# Patient Record
Sex: Male | Born: 1960 | Race: Black or African American | Hispanic: No | Smoking: Never smoker
Health system: Southern US, Community
[De-identification: ages and names within clinical notes are randomized; demographics above are authoritative.]

## PROBLEM LIST (undated history)

## (undated) DIAGNOSIS — J302 Other seasonal allergic rhinitis: Secondary | ICD-10-CM

## (undated) HISTORY — PX: ANKLE SURGERY: SHX546

---

## 2008-07-28 ENCOUNTER — Emergency Department (HOSPITAL_COMMUNITY): Admission: EM | Admit: 2008-07-28 | Discharge: 2008-07-28 | Payer: Self-pay | Admitting: Emergency Medicine

## 2010-04-07 ENCOUNTER — Emergency Department (HOSPITAL_COMMUNITY)
Admission: EM | Admit: 2010-04-07 | Discharge: 2010-04-08 | Payer: Self-pay | Source: Home / Self Care | Admitting: Emergency Medicine

## 2010-04-19 ENCOUNTER — Emergency Department (HOSPITAL_COMMUNITY)
Admission: EM | Admit: 2010-04-19 | Discharge: 2010-04-19 | Payer: Self-pay | Source: Home / Self Care | Admitting: Emergency Medicine

## 2011-05-20 ENCOUNTER — Encounter (HOSPITAL_COMMUNITY): Payer: Self-pay | Admitting: *Deleted

## 2011-05-20 ENCOUNTER — Emergency Department (HOSPITAL_COMMUNITY)
Admission: EM | Admit: 2011-05-20 | Discharge: 2011-05-20 | Disposition: A | Discharge status: Home / Self Care | Payer: Self-pay | Attending: Emergency Medicine | Admitting: Emergency Medicine

## 2011-05-20 DIAGNOSIS — H5789 Other specified disorders of eye and adnexa: Secondary | ICD-10-CM | POA: Insufficient documentation

## 2011-05-20 DIAGNOSIS — H579 Unspecified disorder of eye and adnexa: Secondary | ICD-10-CM | POA: Insufficient documentation

## 2011-05-20 DIAGNOSIS — R0982 Postnasal drip: Secondary | ICD-10-CM | POA: Insufficient documentation

## 2011-05-20 DIAGNOSIS — R51 Headache: Secondary | ICD-10-CM | POA: Insufficient documentation

## 2011-05-20 DIAGNOSIS — J019 Acute sinusitis, unspecified: Secondary | ICD-10-CM | POA: Insufficient documentation

## 2011-05-20 DIAGNOSIS — J3489 Other specified disorders of nose and nasal sinuses: Secondary | ICD-10-CM | POA: Insufficient documentation

## 2011-05-20 DIAGNOSIS — R040 Epistaxis: Secondary | ICD-10-CM | POA: Insufficient documentation

## 2011-05-20 DIAGNOSIS — H9209 Otalgia, unspecified ear: Secondary | ICD-10-CM | POA: Insufficient documentation

## 2011-05-20 HISTORY — DX: Other seasonal allergic rhinitis: J30.2

## 2011-05-20 MED ORDER — TETRACAINE HCL 0.5 % OP SOLN
2.0000 [drp] | Freq: Once | OPHTHALMIC | Status: DC
  Filled 2011-05-20: qty 2

## 2011-05-20 MED ORDER — BUDESONIDE 32 MCG/ACT NA SUSP: 2.0000 | Freq: Every day | NASAL | Status: DC

## 2011-05-20 MED ORDER — HYDROCODONE-ACETAMINOPHEN 5-500 MG PO TABS: 1.0000 | ORAL_TABLET | Freq: Four times a day (QID) | ORAL | Status: AC | PRN

## 2011-05-20 MED ORDER — DOXYCYCLINE HYCLATE 100 MG PO CAPS: 100.0000 mg | ORAL_CAPSULE | Freq: Two times a day (BID) | ORAL | Status: AC

## 2011-05-20 NOTE — ED Provider Notes (Signed)
History     CSN: [id number]  Arrival date & time 05/20/11  1531   First MD Initiated Contact with Patient 05/20/11 1552      Chief Complaint  Patient presents with  . Eye Problem  . Facial Pain    (Consider location/radiation/quality/duration/timing/severity/associated sxs/prior treatment) Patient is a 51 y.o. male presenting with URI. The history is provided by the patient.  URI The primary symptoms include ear pain (right ). Primary symptoms do not include fever, fatigue, headaches, sore throat, cough, wheezing, abdominal pain, nausea, vomiting or rash. Episode onset: about 4 weeks ago  This is a recurrent problem. The problem has been gradually worsening.  Symptoms associated with the illness include facial pain (right side cheek and forehead), sinus pressure (right side), congestion and rhinorrhea. The illness is not associated with chills. The following treatments were addressed: A decongestant was ineffective (zyrtec).    Past Medical History  Diagnosis Date  . Seasonal allergies     History reviewed. No pertinent past surgical history.  History reviewed. No pertinent family history.  History  Substance Use Topics  . Smoking status: Not on file  . Smokeless tobacco: Not on file  . Alcohol Use: No      Review of Systems  Constitutional: Negative for fever, chills, activity change, appetite change and fatigue.  HENT: Positive for ear pain (right ), nosebleeds (occasionally over last month), congestion, rhinorrhea, postnasal drip and sinus pressure (right side). Negative for hearing loss, sore throat, neck pain, neck stiffness and dental problem.   Eyes: Positive for discharge (clear) and itching. Negative for photophobia, redness and visual disturbance.  Respiratory: Negative for cough, shortness of breath and wheezing.   Cardiovascular: Negative for chest pain, palpitations and leg swelling.  Gastrointestinal: Negative for nausea, vomiting, abdominal pain, diarrhea,  constipation and blood in stool.  Genitourinary: Negative for dysuria, urgency, hematuria and flank pain.  Musculoskeletal: Negative for back pain.  Skin: Negative for rash and wound.  Neurological: Negative for dizziness, seizures, facial asymmetry, speech difficulty, weakness, light-headedness, numbness and headaches.  Psychiatric/Behavioral: Negative for confusion.  All other systems reviewed and are negative.    Allergies  Review of patient's allergies indicates no known allergies.  Home Medications   Current Outpatient Rx  Name Route Sig Dispense Refill  . CETIRIZINE HCL 10 MG PO TABS Oral Take 10 mg by mouth daily.      BP 129/86  Pulse 78  Temp(Src) 98.3 F (36.8 C) (Oral)  Resp 20  SpO2 98%  Physical Exam  Nursing note and vitals reviewed. Constitutional: He is oriented to person, place, and time. Vital signs are normal. He appears well-developed and well-nourished.  Non-toxic appearance. No distress.  HENT:  Head: Normocephalic and atraumatic.  Right Ear: Hearing, tympanic membrane, external ear and ear canal normal. No mastoid tenderness.  Left Ear: Hearing, tympanic membrane, external ear and ear canal normal. No mastoid tenderness.  Nose: Mucosal edema (with some superficial erosions), rhinorrhea and sinus tenderness (right side maxillary and frontal sinus tenderness) present. No nasal deformity or septal deviation. No epistaxis. Right sinus exhibits maxillary sinus tenderness and frontal sinus tenderness.  Mouth/Throat: Uvula is midline and oropharynx is clear and moist. Mucous membranes are not dry. No dental caries. No posterior oropharyngeal edema or posterior oropharyngeal erythema.       Post-nasal drainage.  Eyes: Conjunctivae, EOM and lids are normal. Pupils are equal, round, and reactive to light. Right eye exhibits exudate (mild clear drainage). Right eye exhibits no chemosis.  No scleral icterus.  Fundoscopic exam:      The right eye shows no arteriolar  narrowing, no AV nicking, no exudate, no hemorrhage and no papilledema.       The left eye shows no arteriolar narrowing, no AV nicking, no exudate, no hemorrhage and no papilledema.  Neck: Trachea normal and normal range of motion. Neck supple. No JVD present.  Cardiovascular: Normal rate, regular rhythm, normal heart sounds and intact distal pulses.   No murmur heard. Pulmonary/Chest: Effort normal and breath sounds normal. No respiratory distress. He has no wheezes. He has no rales.  Abdominal: Soft. Bowel sounds are normal. He exhibits no distension. There is no tenderness. There is no rebound and no guarding.  Musculoskeletal: Normal range of motion.  Neurological: He is alert and oriented to person, place, and time. He has normal strength. No cranial nerve deficit. GCS eye subscore is 4. GCS verbal subscore is 5. GCS motor subscore is 6.  Skin: Skin is warm and dry. No rash noted. He is not diaphoretic.  Psychiatric: He has a normal mood and affect.    ED Course  Procedures (including critical care time)  Labs Reviewed - No data to display No results found.   1. Sinusitis acute       MDM  50yo AAM with PMH significant for seasonal allergies who presents to the ED due to right sided facial pain. Onset after nasal congestion worsening from baseline seasonal allergies. Last 4 days has had worsening pain right maxillary and frontal sinus region with palpation. Afebrile. NP 129/86. No vision changes. Recent normal complete eye exam. Fundoscopic exam normal. Ears normal. Boggy mucosal edema with some erosion. Will tx with doxy for sinusitis also giving nasal steroids. 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        Verne Carrow, MD 05/20/11 [phone]

## 2011-05-20 NOTE — ED Notes (Signed)
MD at bedside. EDP Webb 

## 2011-05-20 NOTE — ED Notes (Signed)
Reports pain to right side of face adn right eye, reports waking up in the am with redness to right eye.

## 2011-05-20 NOTE — ED Notes (Signed)
Pt reports waking this am with c/o Right cheek pain and Right eye pain. Pt has a hx seasonal allergies

## 2011-05-21 NOTE — ED Provider Notes (Signed)
I saw and evaluated the patient, reviewed the resident's note and I agree with the findings and plan.  No eye redness or tearing. No FB sensation, denies visual changes.  Forbes Cellar, MD 05/21/11 [phone]

## 2011-08-08 ENCOUNTER — Emergency Department (HOSPITAL_COMMUNITY): Payer: Self-pay

## 2011-08-08 ENCOUNTER — Encounter (HOSPITAL_COMMUNITY): Payer: Self-pay | Admitting: Emergency Medicine

## 2011-08-08 ENCOUNTER — Emergency Department (HOSPITAL_COMMUNITY)
Admission: EM | Admit: 2011-08-08 | Discharge: 2011-08-08 | Disposition: A | Discharge status: Home / Self Care | Payer: Self-pay | Attending: Emergency Medicine | Admitting: Emergency Medicine

## 2011-08-08 DIAGNOSIS — J45901 Unspecified asthma with (acute) exacerbation: Secondary | ICD-10-CM | POA: Insufficient documentation

## 2011-08-08 LAB — CBC
HCT: 42.9 % (ref 39.0–52.0)
Hemoglobin: 14.7 g/dL (ref 13.0–17.0)
MCHC: 34.3 g/dL (ref 30.0–36.0)
RBC: 5.4 MIL/uL (ref 4.22–5.81)
WBC: 7.8 10*3/uL (ref 4.0–10.5)

## 2011-08-08 LAB — COMPREHENSIVE METABOLIC PANEL
Alkaline Phosphatase: 62 U/L (ref 39–117)
BUN: 16 mg/dL (ref 6–23)
CO2: 26 mEq/L (ref 19–32)
Chloride: 104 mEq/L (ref 96–112)
Creatinine, Ser: 0.94 mg/dL (ref 0.50–1.35)
GFR calc non Af Amer: >90 mL/min (ref >90)
Glucose, Bld: 96 mg/dL (ref 70–99)
Potassium: 4 mEq/L (ref 3.5–5.1)
Total Bilirubin: 0.4 mg/dL (ref 0.3–1.2)

## 2011-08-08 LAB — DIFFERENTIAL
Lymphocytes Relative: 34 % (ref 12–46)
Lymphs Abs: 2.6 10*3/uL (ref 0.7–4.0)
Monocytes Absolute: 0.3 10*3/uL (ref 0.1–1.0)
Monocytes Relative: 4 % (ref 3–12)
Neutro Abs: 3.8 10*3/uL (ref 1.7–7.7)
Neutrophils Relative %: 48 % (ref 43–77)

## 2011-08-08 MED ORDER — FLUTICASONE PROPIONATE 50 MCG/ACT NA SUSP: 2.0000 | Freq: Every day | NASAL | Status: DC

## 2011-08-08 MED ORDER — IOHEXOL 350 MG/ML SOLN
100.0000 mL | Freq: Once | INTRAVENOUS | Status: AC | PRN
  Administered 2011-08-08: 100 mL via INTRAVENOUS

## 2011-08-08 MED ORDER — MECLIZINE HCL 25 MG PO TABS
25.0000 mg | ORAL_TABLET | Freq: Once | ORAL | Status: AC
  Administered 2011-08-08: 25 mg via ORAL
  Filled 2011-08-08: qty 1

## 2011-08-08 MED ORDER — LORATADINE 10 MG PO TABS: 10.0000 mg | ORAL_TABLET | Freq: Every day | ORAL | Status: AC

## 2011-08-08 MED ORDER — ALBUTEROL SULFATE (5 MG/ML) 0.5% IN NEBU
5.0000 mg | INHALATION_SOLUTION | Freq: Once | RESPIRATORY_TRACT | Status: AC
  Administered 2011-08-08: 5 mg via RESPIRATORY_TRACT
  Filled 2011-08-08: qty 1

## 2011-08-08 MED ORDER — LORATADINE 10 MG PO TABS
10.0000 mg | ORAL_TABLET | Freq: Once | ORAL | Status: AC
  Administered 2011-08-08: 10 mg via ORAL
  Filled 2011-08-08 (×2): qty 1

## 2011-08-08 MED ORDER — PREDNISONE 20 MG PO TABS
60.0000 mg | ORAL_TABLET | Freq: Once | ORAL | Status: AC
  Administered 2011-08-08: 60 mg via ORAL
  Filled 2011-08-08: qty 3

## 2011-08-08 MED ORDER — PREDNISONE 20 MG PO TABS: 60.0000 mg | ORAL_TABLET | Freq: Every day | ORAL | Status: AC

## 2011-08-08 MED ORDER — PREDNISONE 20 MG PO TABS: 60.0000 mg | ORAL_TABLET | Freq: Every day | ORAL | Status: DC

## 2011-08-08 NOTE — ED Provider Notes (Signed)
History     CSN: [id number]  Arrival date & time 08/08/11  1729   First MD Initiated Contact with Patient 08/08/11 1811      Chief Complaint  Patient presents with  . Chest Pain  . Shortness of Breath  . Nasal Congestion    (Consider location/radiation/quality/duration/timing/severity/associated sxs/prior treatment) HPI Comments: Left-sided chest pain reproducible on palpation. His pleuritic. Worse with breathing. Worsening allergies  Patient is a 51 y.o. male presenting with shortness of breath. The history is provided by the patient. No language interpreter was used.  Shortness of Breath  The current episode started yesterday. The onset was gradual. The problem occurs continuously. The problem has been gradually worsening. The problem is moderate. The symptoms are relieved by nothing. The symptoms are aggravated by activity and allergens. Associated symptoms include chest pain, rhinorrhea, cough, shortness of breath and wheezing. Pertinent negatives include no chest pressure, no orthopnea, no fever and no sore throat. The cough is dry. He has had intermittent steroid use. His past medical history is significant for asthma.    Past Medical History  Diagnosis Date  . Seasonal allergies   . Asthma     History reviewed. No pertinent past surgical history.  No family history on file.  History  Substance Use Topics  . Smoking status: Not on file  . Smokeless tobacco: Not on file  . Alcohol Use: No      Review of Systems  Constitutional: Negative for fever, activity change, appetite change and fatigue.  HENT: Positive for congestion and rhinorrhea. Negative for sore throat, neck pain and neck stiffness.   Eyes: Positive for redness and itching.  Respiratory: Positive for cough, shortness of breath and wheezing.   Cardiovascular: Positive for chest pain. Negative for palpitations and orthopnea.  Gastrointestinal: Negative for nausea, vomiting and abdominal pain.    Genitourinary: Negative for dysuria, urgency, frequency and flank pain.  Musculoskeletal: Negative for myalgias, back pain and arthralgias.  Neurological: Negative for dizziness, weakness, light-headedness, numbness and headaches.  All other systems reviewed and are negative.    Allergies  Review of patient's allergies indicates no known allergies.  Home Medications   Current Outpatient Rx  Name Route Sig Dispense Refill  . FEXOFENADINE HCL 180 MG PO TABS Oral Take 180 mg by mouth daily.    Marland Kitchen FLUTICASONE PROPIONATE 50 MCG/ACT NA SUSP Nasal Place 2 sprays into the nose daily. 16 g 0  . LORATADINE 10 MG PO TABS Oral Take 1 tablet (10 mg total) by mouth daily. 30 tablet 0  . PREDNISONE 20 MG PO TABS Oral Take 3 tablets (60 mg total) by mouth daily. 15 tablet 0    BP 120/90  Pulse 72  Temp(Src) 97.8 F (36.6 C) (Oral)  Resp 11  SpO2 100%  Physical Exam  Nursing note and vitals reviewed. Constitutional: He is oriented to person, place, and time. He appears well-developed and well-nourished.  HENT:  Head: Normocephalic and atraumatic.  Right Ear: External ear normal.  Left Ear: External ear normal.  Mouth/Throat: Oropharynx is clear and moist.       Rhinorrhea and significant congestion.  Boggy turbinates  Eyes: Conjunctivae and EOM are normal. Pupils are equal, round, and reactive to light.  Neck: Normal range of motion. Neck supple.  Cardiovascular: Normal rate, regular rhythm, normal heart sounds and intact distal pulses.  Exam reveals no gallop and no friction rub.   No murmur heard. Pulmonary/Chest: Effort normal. He has wheezes. He has no rales. He  exhibits no tenderness.  Abdominal: Soft. Bowel sounds are normal. There is no tenderness. There is no rebound and no guarding.  Musculoskeletal: Normal range of motion. He exhibits no edema and no tenderness.  Neurological: He is alert and oriented to person, place, and time. No cranial nerve deficit.  Skin: Skin is warm and  dry.    ED Course  Procedures (including critical care time)  Labs Reviewed  DIFFERENTIAL - Abnormal; Notable for the following:    Eosinophils Relative 13 (*)    Eosinophils Absolute 1.0 (*)    All other components within normal limits  CBC  COMPREHENSIVE METABOLIC PANEL   Dg Chest 2 View  08/08/2011  *RADIOLOGY REPORT*  Clinical Data: Left breast pain, severe shortness of breath, history asthma  CHEST - 2 VIEW  Comparison: 04/19/2010  Findings: Upper-normal size of cardiac silhouette. Tortuous aorta. Pulmonary vascularity normal. Oblong area of increased density in the right upper lobe, question atelectasis or superimposed artifact but pulmonary nodule is not excluded. Remaining lungs clear. No pleural effusion or pneumothorax. Bones unremarkable.  IMPRESSION: Questionable area of increased density in the right upper lobe, could represent atelectasis, artifact or a pulmonary nodule; recommend CT chest to exclude pulmonary nodule. No acute infiltrate identified.  Original Report Authenticated By: Lollie Marrow, M.D.   Ct Angio Chest W/cm &/or Wo Cm  08/08/2011  *RADIOLOGY REPORT*  Clinical Data: Shortness of breath, chest pain, question pulmonary embolism  CT ANGIOGRAPHY CHEST  Technique:  Multidetector CT imaging of the chest using the standard protocol during bolus administration of intravenous contrast. Multiplanar reconstructed images including MIPs were obtained and reviewed to evaluate the vascular anatomy.  Contrast: OMNIPAQUE IOHEXOL 350 MG/ML SOLN  Comparison: None Correlation:  Chest radiograph 08/08/2011  Findings: Aorta normal caliber without aneurysm or dissection. Pulmonary arteries patent. No evidence of pulmonary embolism. No thoracic adenopathy. Visualized portion of upper abdomen normal appearance. Dependent atelectasis in the lower lobes bilaterally. No definite pulmonary infiltrate, pleural effusion, or pneumothorax. 3 mm right upper lobe nodule image 35, nonspecific. 4  mm right middle lobe nodule image 42, nonspecific. Vague area of sclerosis identified in the posterior right sixth rib, corresponding in position to the abnormality seen on preceding chest radiograph.  IMPRESSION: No evidence of pulmonary embolism. Dependent atelectasis bilateral lower lobes. No right upper lobe mass or nodule identified; abnormality identified on chest radiograph appears to be due to nonspecific sclerosis in the posterior right sixth rib. This is of uncertain etiology and significance. If the patient has prior outside CT chest exams, these would be of benefit in establishing stability of this finding. In the absence of prior CTs to confirm stability, consider follow- up non emergent radionuclide bone scan to assess.  Nonspecific right lung nodules, recommendation below. If the patient is at high risk for bronchogenic carcinoma, follow- up chest CT at 1 year is recommended.  If the patient is at low risk, no follow-up is needed.  This recommendation follows the consensus statement: Guidelines for Management of Small Pulmonary Nodules Detected on CT Scans:  A Statement from the Fleischner Society as published in Radiology 2005; 237:395-400.  Original Report Authenticated By: Lollie Marrow, M.D.     1. Asthma exacerbation   2. Allergic rhinitis       MDM  Asthma exacerbation allergic rhinitis. He was prescribed Claritin, Flonase for his allergic rhinitis type symptoms. He has significant congestion secondary to this. Asthma and wheezing has resolved. He was placed on prednisone. He has  albuterol home. Provided strict return precautions.  No pe on imaging.  Pain has been constant for 6 hours and has since resolved with treatment.  No concern for ACS and ruled out with single trop        Dayton Bailiff, MD 08/08/11 2004

## 2011-08-08 NOTE — ED Notes (Signed)
NO CHEST PAIN AT PRESENT

## 2011-08-08 NOTE — ED Notes (Signed)
Onset 3 days ago nasal congestion shortness of breath and chest pain.  Continued today.  Pain currently 8/10 pressure.  Ax4.

## 2011-08-08 NOTE — ED Notes (Signed)
The pt says he has had nasal congestion and he has allergies and he has been coughing and wheezing since last pm.  Non-productive cough

## 2011-08-08 NOTE — ED Notes (Signed)
Iv inserted in the rt a-c  # 20 SALINE LOK FOR C-T OF CHEST

## 2011-08-08 NOTE — Discharge Instructions (Signed)
Allergic Rhinitis Allergic rhinitis is when the mucous membranes in the nose respond to allergens. Allergens are particles in the air that cause your body to have an allergic reaction. This causes you to release allergic antibodies. Through a chain of events, these eventually cause you to release histamine into the blood stream (hence the use of antihistamines). Although meant to be protective to the body, it is this release that causes your discomfort, such as frequent sneezing, congestion and an itchy runny nose.  CAUSES  The pollen allergens may come from grasses, trees, and weeds. This is seasonal allergic rhinitis, or "hay fever." Other allergens cause year-round allergic rhinitis (perennial allergic rhinitis) such as house dust mite allergen, pet dander and mold spores.  SYMPTOMS   Nasal stuffiness (congestion).   Runny, itchy nose with sneezing and tearing of the eyes.   There is often an itching of the mouth, eyes and ears.  It cannot be cured, but it can be controlled with medications. DIAGNOSIS  If you are unable to determine the offending allergen, skin or blood testing may find it. TREATMENT   Avoid the allergen.   Medications and allergy shots (immunotherapy) can help.   Hay fever may often be treated with antihistamines in pill or nasal spray forms. Antihistamines block the effects of histamine. There are over-the-counter medicines that may help with nasal congestion and swelling around the eyes. Check with your caregiver before taking or giving this medicine.  If the treatment above does not work, there are many new medications your caregiver can prescribe. Stronger medications may be used if initial measures are ineffective. Desensitizing injections can be used if medications and avoidance fails. Desensitization is when a patient is given ongoing shots until the body becomes less sensitive to the allergen. Make sure you follow up with your caregiver if problems continue. SEEK  MEDICAL CARE IF:   You develop fever (more than 100.5 F (38.1 C).   You develop a cough that does not stop easily (persistent).   You have shortness of breath.   You start wheezing.   Symptoms interfere with normal daily activities.  Document Released: 12/20/2000 Document Revised: 03/16/2011 Document Reviewed: 07/01/2008 [employer] Patient Information 2012 [employer].Asthma, Adult Asthma is caused by narrowing of the air passages in the lungs. It may be triggered by pollen, dust, animal dander, molds, some foods, respiratory infections, exposure to smoke, exercise, emotional stress or other allergens (things that cause allergic reactions or allergies). Repeat attacks are common. HOME CARE INSTRUCTIONS   Use prescription medications as ordered by your caregiver.   Avoid pollen, dust, animal dander, molds, smoke and other things that cause attacks at home and at work.   You may have fewer attacks if you decrease dust in your home. Electrostatic air cleaners may help.   It may help to replace your pillows or mattress with materials less likely to cause allergies.   Talk to your caregiver about an action plan for managing asthma attacks at home, including, the use of a peak flow meter which measures the severity of your asthma attack. An action plan can help minimize or stop the attack without having to seek medical care.   If you are not on a fluid restriction, drink 8 to 10 glasses of water each day.   Always have a plan prepared for seeking medical attention, including, calling your physician, accessing local emergency care, and calling 911 (in the [place] for a severe attack.   Discuss possible exercise routines with your   caregiver.   If animal dander is the cause of asthma, you may need to get rid of pets.  SEEK MEDICAL CARE IF:   You have wheezing and shortness of breath even if taking medicine to prevent attacks.   You have muscle aches, chest pain or thickening of sputum.     Your sputum changes from clear or white to yellow, green, gray, or bloody.   You have any problems that may be related to the medicine you are taking (such as a rash, itching, swelling or trouble breathing).  SEEK IMMEDIATE MEDICAL CARE IF:   Your usual medicines do not stop your wheezing or there is increased coughing and/or shortness of breath.   You have increased difficulty breathing.   You have a fever.  MAKE SURE YOU:   Understand these instructions.   Will watch your condition.   Will get help right away if you are not doing well or get worse.  Document Released: 03/27/2005 Document Revised: 03/16/2011 Document Reviewed: 11/13/2007 [employer] Patient Information 2012 [employer]. 

## 2011-08-09 LAB — POCT I-STAT TROPONIN I: Troponin i, poc: 0 ng/mL (ref 0.00–0.08)

## 2013-02-13 ENCOUNTER — Encounter (HOSPITAL_BASED_OUTPATIENT_CLINIC_OR_DEPARTMENT_OTHER): Payer: Self-pay | Admitting: Emergency Medicine

## 2013-02-13 ENCOUNTER — Emergency Department (HOSPITAL_BASED_OUTPATIENT_CLINIC_OR_DEPARTMENT_OTHER)
Admission: EM | Admit: 2013-02-13 | Discharge: 2013-02-13 | Disposition: A | Discharge status: Home / Self Care | Payer: 59 | Attending: Emergency Medicine | Admitting: Emergency Medicine

## 2013-02-13 DIAGNOSIS — R21 Rash and other nonspecific skin eruption: Secondary | ICD-10-CM | POA: Diagnosis present

## 2013-02-13 DIAGNOSIS — L299 Pruritus, unspecified: Secondary | ICD-10-CM | POA: Diagnosis not present

## 2013-02-13 DIAGNOSIS — Z79899 Other long term (current) drug therapy: Secondary | ICD-10-CM | POA: Diagnosis not present

## 2013-02-13 DIAGNOSIS — J45901 Unspecified asthma with (acute) exacerbation: Secondary | ICD-10-CM | POA: Insufficient documentation

## 2013-02-13 DIAGNOSIS — L282 Other prurigo: Secondary | ICD-10-CM

## 2013-02-13 MED ORDER — PREDNISONE 20 MG PO TABS: ORAL_TABLET | ORAL | Status: DC

## 2013-02-13 MED ORDER — HYDROXYZINE HCL 25 MG PO TABS: 25.0000 mg | ORAL_TABLET | Freq: Four times a day (QID) | ORAL | Status: DC

## 2013-02-13 NOTE — ED Notes (Addendum)
Rash on his legs, arms and hands since last night. States he vomited and started wheezing 3 hours ago. Took Vistaril last night to stop the itching. States his throat feels dry. No respiratory distress. Hives on his upper legs. His hands are red.

## 2013-02-13 NOTE — ED Notes (Signed)
Started wheezing and having rash last pm, unknown cause  Stills has rash and itching,  No wheezing at present

## 2013-02-13 NOTE — ED Provider Notes (Signed)
CSN: [id number]     Arrival date & time 02/13/13  1447 History   First MD Initiated Contact with Patient 02/13/13 1500     Chief Complaint  Patient presents with  . Rash  . Wheezing   (Consider location/radiation/quality/duration/timing/severity/associated sxs/prior Treatment) HPI Comments: 52 year old male with history of chronic allergies presents with a rash and itching since last night. He states he has severe allergies to mites, dust, and other seasonal allergies and has to usually use specific soaps and other detergents due to how severe his reactions are. He states last night started having itching similar to the time he had one year ago. He states he had a leftover hydroxyzine she took that which partially relieved his symptoms. He did not have any new exposures to food, detergents, or other known allergens. He's not having any current respiratory symptoms. He states he did vomit his lunch does not feel ill since then. Denies abdominal pain, current nausea, or wheezing. He states his throat was also or after the vomiting episode but denies any shortness of breath. He does have a history of asthma states he took his inhaler earlier and currently has no symptoms.   Past Medical History  Diagnosis Date  . Seasonal allergies   . Asthma    History reviewed. No pertinent past surgical history. No family history on file. History  Substance Use Topics  . Smoking status: Never Smoker   . Smokeless tobacco: Not on file  . Alcohol Use: No    Review of Systems  HENT: Negative for sore throat, trouble swallowing and voice change.   Respiratory: Negative for shortness of breath and wheezing.   Gastrointestinal: Positive for vomiting (once). Negative for nausea, abdominal pain and diarrhea.  Skin: Positive for rash.  All other systems reviewed and are negative.    Allergies  Review of patient's allergies indicates no known allergies.  Home Medications   Current Outpatient Rx  Name   Route  Sig  Dispense  Refill  . fexofenadine (ALLEGRA) 180 MG tablet   Oral   Take 180 mg by mouth daily.         Marland Kitchen EXPIRED: fluticasone (FLONASE) 50 MCG/ACT nasal spray   Nasal   Place 2 sprays into the nose daily.   16 g   0   . hydrOXYzine (ATARAX/VISTARIL) 25 MG tablet   Oral   Take 1 tablet (25 mg total) by mouth every 6 (six) hours.   12 tablet   0   . EXPIRED: loratadine (CLARITIN) 10 MG tablet   Oral   Take 1 tablet (10 mg total) by mouth daily.   30 tablet   0   . predniSONE (DELTASONE) 20 MG tablet      2 tabs po daily x 5 days   10 tablet   0    BP 140/88  Pulse 83  Temp(Src) 98.9 F (37.2 C) (Oral)  Resp 18  SpO2 100% Physical Exam  Nursing note and vitals reviewed. Constitutional: He is oriented to person, place, and time. He appears well-developed and well-nourished. No distress.  HENT:  Head: Normocephalic and atraumatic.  Right Ear: External ear normal.  Left Ear: External ear normal.  Nose: Nose normal.  Eyes: Right eye exhibits no discharge. Left eye exhibits no discharge.  Neck: Neck supple.  Cardiovascular: Normal rate, regular rhythm, normal heart sounds and intact distal pulses.   Pulmonary/Chest: Effort normal and breath sounds normal. No stridor. He has no wheezes.  Abdominal: Soft. There  is no tenderness.  Musculoskeletal: He exhibits no edema.  Neurological: He is alert and oriented to person, place, and time.  Skin: Skin is warm and dry. Rash noted. Rash is urticarial (vague urticarial rash on lower extremities and upper extremities).    ED Course  Procedures (including critical care time) Labs Review Labs Reviewed - No data to display Imaging Review No results found.  EKG Interpretation   None       MDM   1. Pruritic rash    Patient with severe allergies with a recurrent paretic rash. It is difficult to decipher exactly what the rash is like to skin color. However there no other signs of systemic allergic reaction.  He did state he vomited after lunch several hours ago but does not have any current wheezing, throat symptoms, recurrent nausea or abdominal pain. I do not think there is a risk of anaphylaxis. We'll treat with Vistaril and a prednisone burst. Given the degree of his allergic symptoms in the past I recommended that he followup with an allergist.    Audree Camel, MD 02/13/13 1524

## 2013-04-14 ENCOUNTER — Emergency Department (HOSPITAL_BASED_OUTPATIENT_CLINIC_OR_DEPARTMENT_OTHER)
Admission: EM | Admit: 2013-04-14 | Discharge: 2013-04-14 | Disposition: A | Discharge status: Home / Self Care | Payer: Managed Care, Other (non HMO) | Attending: Emergency Medicine | Admitting: Emergency Medicine

## 2013-04-14 ENCOUNTER — Encounter (HOSPITAL_BASED_OUTPATIENT_CLINIC_OR_DEPARTMENT_OTHER): Payer: Self-pay | Admitting: Emergency Medicine

## 2013-04-14 DIAGNOSIS — J45909 Unspecified asthma, uncomplicated: Secondary | ICD-10-CM | POA: Insufficient documentation

## 2013-04-14 DIAGNOSIS — IMO0002 Reserved for concepts with insufficient information to code with codable children: Secondary | ICD-10-CM | POA: Insufficient documentation

## 2013-04-14 DIAGNOSIS — T492X5A Adverse effect of local astringents and local detergents, initial encounter: Secondary | ICD-10-CM | POA: Insufficient documentation

## 2013-04-14 DIAGNOSIS — L509 Urticaria, unspecified: Secondary | ICD-10-CM | POA: Insufficient documentation

## 2013-04-14 DIAGNOSIS — Z79899 Other long term (current) drug therapy: Secondary | ICD-10-CM | POA: Insufficient documentation

## 2013-04-14 DIAGNOSIS — Z9109 Other allergy status, other than to drugs and biological substances: Secondary | ICD-10-CM | POA: Insufficient documentation

## 2013-04-14 MED ORDER — PREDNISONE 20 MG PO TABS
40.0000 mg | ORAL_TABLET | Freq: Every day | ORAL | Status: DC
Start: 2013-04-14 — End: 2013-12-31

## 2013-04-14 MED ORDER — DIPHENHYDRAMINE HCL 50 MG/ML IJ SOLN
25.0000 mg | Freq: Once | INTRAMUSCULAR | Status: AC
  Administered 2013-04-14: 25 mg via INTRAMUSCULAR
  Filled 2013-04-14: qty 1

## 2013-04-14 MED ORDER — METHYLPREDNISOLONE SODIUM SUCC 125 MG IJ SOLR
125.0000 mg | Freq: Once | INTRAMUSCULAR | Status: AC
  Administered 2013-04-14: 125 mg via INTRAMUSCULAR
  Filled 2013-04-14: qty 2

## 2013-04-14 NOTE — Discharge Instructions (Signed)
Allergies °Allergies may happen from anything your body is sensitive to. This may be food, medicines, pollens, chemicals, and nearly anything around you in everyday life that produces allergens. An allergen is anything that causes an allergy producing substance. Heredity is often a factor in causing these problems. This means you may have some of the same allergies as your parents. °Food allergies happen in all age groups. Food allergies are some of the most severe and life threatening. Some common food allergies are cow's milk, seafood, eggs, nuts, wheat, and soybeans. °SYMPTOMS  °· Swelling around the mouth. °· An itchy red rash or hives. °· Vomiting or diarrhea. °· Difficulty breathing. °SEVERE ALLERGIC REACTIONS ARE LIFE-THREATENING. °This reaction is called anaphylaxis. It can cause the mouth and throat to swell and cause difficulty with breathing and swallowing. In severe reactions only a trace amount of food (for example, peanut oil in a salad) may cause death within seconds. °Seasonal allergies occur in all age groups. These are seasonal because they usually occur during the same season every year. They may be a reaction to molds, grass pollens, or tree pollens. Other causes of problems are house dust mite allergens, pet dander, and mold spores. The symptoms often consist of nasal congestion, a runny itchy nose associated with sneezing, and tearing itchy eyes. There is often an associated itching of the mouth and ears. The problems happen when you come in contact with pollens and other allergens. Allergens are the particles in the air that the body reacts to with an allergic reaction. This causes you to release allergic antibodies. Through a chain of events, these eventually cause you to release histamine into the blood stream. Although it is meant to be protective to the body, it is this release that causes your discomfort. This is why you were given anti-histamines to feel better.  If you are unable to  pinpoint the offending allergen, it may be determined by skin or blood testing. Allergies cannot be cured but can be controlled with medicine. °Hay fever is a collection of all or some of the seasonal allergy problems. It may often be treated with simple over-the-counter medicine such as diphenhydramine. Take medicine as directed. Do not drink alcohol or drive while taking this medicine. Check with your caregiver or package insert for child dosages. °If these medicines are not effective, there are many new medicines your caregiver can prescribe. Stronger medicine such as nasal spray, eye drops, and corticosteroids may be used if the first things you try do not work well. Other treatments such as immunotherapy or desensitizing injections can be used if all else fails. Follow up with your caregiver if problems continue. These seasonal allergies are usually not life threatening. They are generally more of a nuisance that can often be handled using medicine. °HOME CARE INSTRUCTIONS  °· If unsure what causes a reaction, keep a diary of foods eaten and symptoms that follow. Avoid foods that cause reactions. °· If hives or rash are present: °· Take medicine as directed. °· You may use an over-the-counter antihistamine (diphenhydramine) for hives and itching as needed. °· Apply cold compresses (cloths) to the skin or take baths in cool water. Avoid hot baths or showers. Heat will make a rash and itching worse. °· If you are severely allergic: °· Following a treatment for a severe reaction, hospitalization is often required for closer follow-up. °· Wear a medic-alert bracelet or necklace stating the allergy. °· You and your family must learn how to give adrenaline or use   an anaphylaxis kit. °· If you have had a severe reaction, always carry your anaphylaxis kit or EpiPen® with you. Use this medicine as directed by your caregiver if a severe reaction is occurring. Failure to do so could have a fatal outcome. °SEEK MEDICAL  CARE IF: °· You suspect a food allergy. Symptoms generally happen within 30 minutes of eating a food. °· Your symptoms have not gone away within 2 days or are getting worse. °· You develop new symptoms. °· You want to retest yourself or your child with a food or drink you think causes an allergic reaction. Never do this if an anaphylactic reaction to that food or drink has happened before. Only do this under the care of a caregiver. °SEEK IMMEDIATE MEDICAL CARE IF:  °· You have difficulty breathing, are wheezing, or have a tight feeling in your chest or throat. °· You have a swollen mouth, or you have hives, swelling, or itching all over your body. °· You have had a severe reaction that has responded to your anaphylaxis kit or an EpiPen®. These reactions may return when the medicine has worn off. These reactions should be considered life threatening. °MAKE SURE YOU:  °· Understand these instructions. °· Will watch your condition. °· Will get help right away if you are not doing well or get worse. °Document Released: 06/20/2002 Document Revised: 07/22/2012 Document Reviewed: 11/25/2007 °[employer]® Patient Information ©2014 [employer]. ° °

## 2013-04-14 NOTE — ED Provider Notes (Signed)
Medical screening examination/treatment/procedure(s) were performed by non-physician practitioner and as supervising physician I was immediately available for consultation/collaboration.  EKG Interpretation   None         Megan E Docherty, MD 04/14/13 2340 

## 2013-04-14 NOTE — ED Notes (Addendum)
Hives off and on x 4 days. Has been taking Benadryl and Vistaril. Had left over Prednisone and took what he had. He had the same thing happen to him in November.

## 2013-04-14 NOTE — ED Provider Notes (Signed)
CSN: [id number]     Arrival date & time 04/14/13  1559 History   First MD Initiated Contact with Patient 04/14/13 1649     Chief Complaint  Patient presents with  . Urticaria   (Consider location/radiation/quality/duration/timing/severity/associated sxs/prior Treatment) HPI Comments: Pt states that he has had intermittent episodes of hives over the last 2 months:pt states that he has changed detergents and body washes and the symptoms have continued:pt denies oral swelling or difficulty breathing:no fever  The history is provided by the patient. No language interpreter was used.    Past Medical History  Diagnosis Date  . Seasonal allergies   . Asthma    History reviewed. No pertinent past surgical history. No family history on file. History  Substance Use Topics  . Smoking status: Never Smoker   . Smokeless tobacco: Not on file  . Alcohol Use: No    Review of Systems  Constitutional: Negative.   Respiratory: Negative.   Cardiovascular: Negative.     Allergies  Review of patient's allergies indicates no known allergies.  Home Medications   Current Outpatient Rx  Name  Route  Sig  Dispense  Refill  . fexofenadine (ALLEGRA) 180 MG tablet   Oral   Take 180 mg by mouth daily.         Marland Kitchen. EXPIRED: fluticasone (FLONASE) 50 MCG/ACT nasal spray   Nasal   Place 2 sprays into the nose daily.   16 g   0   . hydrOXYzine (ATARAX/VISTARIL) 25 MG tablet   Oral   Take 1 tablet (25 mg total) by mouth every 6 (six) hours.   12 tablet   0   . EXPIRED: loratadine (CLARITIN) 10 MG tablet   Oral   Take 1 tablet (10 mg total) by mouth daily.   30 tablet   0   . predniSONE (DELTASONE) 20 MG tablet      2 tabs po daily x 5 days   10 tablet   0    BP 116/86  Pulse 78  Temp(Src) 98.1 F (36.7 C) (Oral)  Resp 18  Ht 5\' 11"  (1.803 m)  Wt 245 lb (111.131 kg)  BMI 34.19 kg/m2  SpO2 100% Physical Exam  Nursing note and vitals reviewed. Constitutional: He is oriented to  person, place, and time. He appears well-developed and well-nourished.  HENT:  Head: Normocephalic and atraumatic.  Right Ear: External ear normal.  Left Ear: External ear normal.  Eyes: Conjunctivae and EOM are normal.  Cardiovascular: Normal rate and regular rhythm.   Pulmonary/Chest: Effort normal and breath sounds normal.  Musculoskeletal: Normal range of motion.  Neurological: He is alert and oriented to person, place, and time.  Skin: Skin is warm.  Large red papules to entire body  Psychiatric: He has a normal mood and affect.    ED Course  Procedures (including critical care time) Labs Review Labs Reviewed - No data to display Imaging Review No results found.  EKG Interpretation   None       MDM   1. Hives    Hives are decreasing:will send home with steroids:discussed follow up with an allergist with the pt   Teressa LowerVrinda Blen Ransome, NP 04/14/13 1805

## 2013-05-11 ENCOUNTER — Encounter (HOSPITAL_BASED_OUTPATIENT_CLINIC_OR_DEPARTMENT_OTHER): Payer: Self-pay | Admitting: Emergency Medicine

## 2013-05-11 ENCOUNTER — Emergency Department (HOSPITAL_BASED_OUTPATIENT_CLINIC_OR_DEPARTMENT_OTHER)
Admission: EM | Admit: 2013-05-11 | Discharge: 2013-05-11 | Disposition: A | Discharge status: Home / Self Care | Payer: Managed Care, Other (non HMO) | Attending: Emergency Medicine | Admitting: Emergency Medicine

## 2013-05-11 DIAGNOSIS — Z79899 Other long term (current) drug therapy: Secondary | ICD-10-CM | POA: Insufficient documentation

## 2013-05-11 DIAGNOSIS — L509 Urticaria, unspecified: Secondary | ICD-10-CM | POA: Insufficient documentation

## 2013-05-11 DIAGNOSIS — J45909 Unspecified asthma, uncomplicated: Secondary | ICD-10-CM | POA: Insufficient documentation

## 2013-05-11 MED ORDER — PREDNISONE 50 MG PO TABS
60.0000 mg | ORAL_TABLET | Freq: Once | ORAL | Status: AC
  Administered 2013-05-11: 60 mg via ORAL
  Filled 2013-05-11 (×2): qty 1

## 2013-05-11 MED ORDER — HYDROXYZINE HCL 25 MG PO TABS: 25.0000 mg | ORAL_TABLET | Freq: Four times a day (QID) | ORAL | Status: DC

## 2013-05-11 MED ORDER — HYDROXYZINE HCL 25 MG PO TABS
50.0000 mg | ORAL_TABLET | Freq: Once | ORAL | Status: AC
  Administered 2013-05-11: 50 mg via ORAL
  Filled 2013-05-11: qty 2

## 2013-05-11 MED ORDER — PREDNISONE 10 MG PO TABS
20.0000 mg | ORAL_TABLET | Freq: Every day | ORAL | Status: DC
Start: 2013-05-11 — End: 2013-12-31

## 2013-05-11 NOTE — ED Notes (Signed)
Patient c/o red itching rash on upper and lower body since last night, took benadry at 22:00 but no relief, states appointment this month  For allergies

## 2013-05-11 NOTE — Discharge Instructions (Signed)

## 2013-05-13 NOTE — ED Provider Notes (Signed)
CSN: [id number]     Arrival date & time 05/11/13  1038 History   First MD Initiated Contact with Patient 05/11/13 1048     Chief Complaint  Patient presents with  . Rash   (Consider location/radiation/quality/duration/timing/severity/associated sxs/prior Treatment) HPI  53 year old male comes in today complaining of rash that began last night. It has moved on to different parts of his body and resolved in other areas. It is very itchy. He has not had any associated symptoms of throat swelling, nasal discharge, vomiting, diarrhea, or difficulty breathing. He had a similar episode a month ago and was diagnosed with hives. He got better with Vistaril and prednisone at that time. He has taken Benadryl at home with some relief but has not taken it again today. He does not have any history of aspirin allergy or eczema or asthma.  Past Medical History  Diagnosis Date  . Seasonal allergies   . Asthma    Past Surgical History  Procedure Laterality Date  . Ankle surgery Right    No family history on file. History  Substance Use Topics  . Smoking status: Never Smoker   . Smokeless tobacco: Not on file  . Alcohol Use: No    Review of Systems  All other systems reviewed and are negative.    Allergies  Review of patient's allergies indicates no known allergies.  Home Medications   Current Outpatient Rx  Name  Route  Sig  Dispense  Refill  . fexofenadine (ALLEGRA) 180 MG tablet   Oral   Take 180 mg by mouth daily.         Marland Kitchen. EXPIRED: fluticasone (FLONASE) 50 MCG/ACT nasal spray   Nasal   Place 2 sprays into the nose daily.   16 g   0   . hydrOXYzine (ATARAX/VISTARIL) 25 MG tablet   Oral   Take 1 tablet (25 mg total) by mouth every 6 (six) hours.   12 tablet   0   . hydrOXYzine (ATARAX/VISTARIL) 25 MG tablet   Oral   Take 1 tablet (25 mg total) by mouth every 6 (six) hours.   12 tablet   0   . EXPIRED: loratadine (CLARITIN) 10 MG tablet   Oral   Take 1 tablet (10 mg  total) by mouth daily.   30 tablet   0   . predniSONE (DELTASONE) 10 MG tablet   Oral   Take 2 tablets (20 mg total) by mouth daily.   15 tablet   0   . predniSONE (DELTASONE) 20 MG tablet      2 tabs po daily x 5 days   10 tablet   0   . predniSONE (DELTASONE) 20 MG tablet   Oral   Take 2 tablets (40 mg total) by mouth daily.   10 tablet   0    BP 118/74  Pulse 76  Temp(Src) 98 F (36.7 C) (Oral)  Resp 18  SpO2 100% Physical Exam  Nursing note and vitals reviewed. Constitutional: He is oriented to person, place, and time. He appears well-developed and well-nourished.  HENT:  Head: Normocephalic and atraumatic.  Right Ear: External ear normal.  Nose: Nose normal.  Mouth/Throat: Oropharynx is clear and moist.  Eyes: Conjunctivae and EOM are normal. Pupils are equal, round, and reactive to light.  Neck: Normal range of motion. Neck supple.  Cardiovascular: Normal rate, regular rhythm and normal heart sounds.   Pulmonary/Chest: Effort normal and breath sounds normal.  Abdominal: Soft. Bowel sounds are  normal.  Musculoskeletal: Normal range of motion.  Neurological: He is alert and oriented to person, place, and time. He has normal reflexes.  Skin: Skin is dry. Rash noted.  Slightly raised erythematous areas diffusely on his lower legs back and upper arms consistent with hives.  Psychiatric: He has a normal mood and affect. His behavior is normal. Thought content normal.    ED Course  Procedures (including critical care time) Labs Review Labs Reviewed - No data to display Imaging Review No results found.  EKG Interpretation   None       MDM   1. Hives         Hilario Quarry, MD 05/13/13 [phone]

## 2013-12-31 ENCOUNTER — Encounter (HOSPITAL_COMMUNITY): Payer: Self-pay | Admitting: Emergency Medicine

## 2013-12-31 ENCOUNTER — Emergency Department (HOSPITAL_COMMUNITY)
Admission: EM | Admit: 2013-12-31 | Discharge: 2013-12-31 | Disposition: A | Discharge status: Home / Self Care | Payer: Managed Care, Other (non HMO) | Attending: Emergency Medicine | Admitting: Emergency Medicine

## 2013-12-31 DIAGNOSIS — Z792 Long term (current) use of antibiotics: Secondary | ICD-10-CM | POA: Insufficient documentation

## 2013-12-31 DIAGNOSIS — L259 Unspecified contact dermatitis, unspecified cause: Secondary | ICD-10-CM | POA: Diagnosis not present

## 2013-12-31 DIAGNOSIS — L03039 Cellulitis of unspecified toe: Secondary | ICD-10-CM | POA: Insufficient documentation

## 2013-12-31 DIAGNOSIS — M79609 Pain in unspecified limb: Secondary | ICD-10-CM | POA: Insufficient documentation

## 2013-12-31 DIAGNOSIS — L239 Allergic contact dermatitis, unspecified cause: Secondary | ICD-10-CM

## 2013-12-31 DIAGNOSIS — L509 Urticaria, unspecified: Secondary | ICD-10-CM | POA: Diagnosis not present

## 2013-12-31 DIAGNOSIS — IMO0002 Reserved for concepts with insufficient information to code with codable children: Secondary | ICD-10-CM | POA: Diagnosis not present

## 2013-12-31 DIAGNOSIS — Z79899 Other long term (current) drug therapy: Secondary | ICD-10-CM | POA: Insufficient documentation

## 2013-12-31 DIAGNOSIS — L02619 Cutaneous abscess of unspecified foot: Secondary | ICD-10-CM | POA: Insufficient documentation

## 2013-12-31 DIAGNOSIS — L03031 Cellulitis of right toe: Secondary | ICD-10-CM

## 2013-12-31 MED ORDER — CLINDAMYCIN HCL 150 MG PO CAPS: 150.0000 mg | ORAL_CAPSULE | Freq: Four times a day (QID) | ORAL | Status: DC

## 2013-12-31 MED ORDER — PREDNISONE 20 MG PO TABS: ORAL_TABLET | ORAL | Status: DC

## 2013-12-31 MED ORDER — TRAMADOL HCL 50 MG PO TABS: 50.0000 mg | ORAL_TABLET | Freq: Four times a day (QID) | ORAL | Status: DC | PRN

## 2013-12-31 MED ORDER — HYDROXYZINE HCL 25 MG PO TABS: 25.0000 mg | ORAL_TABLET | Freq: Four times a day (QID) | ORAL | Status: DC | PRN

## 2013-12-31 MED ORDER — HYDROXYZINE HCL 25 MG PO TABS
25.0000 mg | ORAL_TABLET | Freq: Once | ORAL | Status: AC
  Administered 2013-12-31: 25 mg via ORAL
  Filled 2013-12-31: qty 1

## 2013-12-31 MED ORDER — TRAMADOL HCL 50 MG PO TABS
50.0000 mg | ORAL_TABLET | Freq: Once | ORAL | Status: AC
Start: 2013-12-31 — End: 2013-12-31
  Administered 2013-12-31: 50 mg via ORAL
  Filled 2013-12-31: qty 1

## 2013-12-31 NOTE — ED Notes (Signed)
Pt. Stated, I started having right foot pain and itching around 1330 today.  The pain goes to my lower leg. Medial part of right foot red and swollen

## 2013-12-31 NOTE — ED Provider Notes (Signed)
CSN: [id number]     Arrival date & time 12/31/13  1706 History  This chart was scribed for non-physician practitioner, Junius Finner, PA-C, PA-C,working with Richardean Canal, MD, by Karle Plumber, ED Scribe. This patient was seen in room TR09C/TR09C and the patient's care was started at 6:18 PM.   Chief Complaint  Patient presents with  . Foot Pain  . Leg Pain   Patient is a 53 y.o. male presenting with lower extremity pain and leg pain. The history is provided by the patient. No language interpreter was used.  Foot Pain  Leg Pain Associated symptoms: no fever    HPI Comments:  Curtis Savage is a 53 y.o. 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 Pt reports associated swelling of the area. He reports blistering of his right great toe with some clear drainage. He reports itching to the right-rib cage and left hand. He reports applying Gold Bond powder with some relief of the symptoms. He reports achilles tendon repair 6 years ago in [place]. Pt states he contracted MRSA while in the hospital. 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 He denies fever or chills. He cannot recall anything coming into contact with the right foot. He denies h/o DM or gout. He is allergic to clotrimazole antifungal cream.  Past Medical History  Diagnosis Date  . Seasonal allergies   . Asthma    Past Surgical History  Procedure Laterality Date  . Ankle surgery Right    No family history on file. History  Substance Use Topics  . Smoking status: Never Smoker   . Smokeless tobacco: Not on file  . Alcohol Use: No    Review of Systems  Constitutional: Negative for fever and chills.  Musculoskeletal: Positive for arthralgias.  Skin: Positive for rash.  All other systems reviewed and are negative.   Allergies  Review of patient's allergies indicates no known allergies.  Home Medications   Prior to Admission  medications   Medication Sig Start Date End Date Taking? Authorizing Provider  loratadine (CLARITIN) 10 MG tablet Take 1 tablet (10 mg total) by mouth daily. 08/08/11 12/31/13 Yes Dayton Bailiff, MD  Multiple Vitamin (MULTIVITAMIN WITH MINERALS) TABS tablet Take 1 tablet by mouth daily.   Yes Historical Provider, MD  clindamycin (CLEOCIN) 150 MG capsule Take 1 capsule (150 mg total) by mouth every 6 (six) hours. 12/31/13   Junius Finner, PA-C  hydrOXYzine (ATARAX/VISTARIL) 25 MG tablet Take 1 tablet (25 mg total) by mouth every 6 (six) hours as needed for itching. 12/31/13   Junius Finner, PA-C  predniSONE (DELTASONE) 20 MG tablet 3 tabs po day one, then 2 po daily x 4 days 12/31/13   Junius Finner, PA-C  traMADol (ULTRAM) 50 MG tablet Take 1 tablet (50 mg total) by mouth every 6 (six) hours as needed. 12/31/13   Junius Finner, PA-C   Triage Vitals: BP 119/83  Pulse 92  Temp(Src) 98.3 F (36.8 C) (Oral)  Resp 17  Ht  (1.803 m)  Wt 241 lb (109.317 kg)  BMI 33.63 kg/m2  SpO2 98% Physical Exam  Nursing note and vitals reviewed. Constitutional: He is oriented to person, place, and time. He appears well-developed and well-nourished.  HENT:  Head: Normocephalic and atraumatic.  Eyes: EOM are normal.  Neck: Normal range of motion.  Cardiovascular: Normal rate.   Pulmonary/Chest: Effort normal.  Musculoskeletal: Normal range of motion.  Neurological: He  is alert and oriented to person, place, and time.  Skin: Skin is warm and dry. There is erythema.  Right foot is erythematous with moderate edema. Two large blisters with serous fluid over right great toe. Clear discharge noted. Tender to palpation of the area. Hive to left medial thigh. Hive to right flank.  Psychiatric: He has a normal mood and affect. His behavior is normal.    ED Course  Procedures (including critical care time) DIAGNOSTIC STUDIES: Oxygen Saturation is 98% on RA, normal by my interpretation.   COORDINATION OF  CARE: 6:23 PM- Will speak with Dr. Silverio Lay about appropriate course of treatment. Pt verbalizes understanding and agrees to plan.  6:24 PM- Dr. Silverio Lay at bedside to see patient.   Medications  hydrOXYzine (ATARAX/VISTARIL) tablet 25 mg (25 mg Oral Given 12/31/13 1833)  traMADol (ULTRAM) tablet 50 mg (50 mg Oral Given 12/31/13 1842)    Labs Review Labs Reviewed - No data to display  Imaging Review No results found.   EKG Interpretation None      MDM   Final diagnoses:  Allergic dermatitis  Cellulitis of toe of right foot  Urticaria    Pt c/o right foot pain, swelling and itching. Exam concerning for allergic dermatitis with underlying cellulitis. Discussed pt with Dr. Silverio Lay who also examined pt. Will tx with hydroxyzine, prednisone, tramadol and clindamycin. Advised to f/u with PCP in 3-4 days for recheck of symptoms if not improving. Return precautions provided. Pt verbalized understanding and agreement with tx plan.   I personally performed the services described in this documentation, which was scribed in my presence. The recorded information has been reviewed and is accurate.    Junius Finner, PA-C 01/01/14 0202

## 2014-01-01 NOTE — ED Provider Notes (Signed)
Medical screening examination/treatment/procedure(s) were conducted as a shared visit with non-physician practitioner(s) and myself.  I personally evaluated the patient during the encounter.   EKG Interpretation None      Curtis Savage is a 53 y.o. male hx of MRSA here with itching and redness R great toe area. 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 Denies hx of diabetes. On exam, some bullae base of R big toe with some surrounding cellulitis. I am concerned for either allergic dermatitis or cellulitis. Its not vesicular so I doubt shingles. Not involving the joint and I doubt gout or septic joint. Given hydroxyzine, steroids, pain meds, and clinda. Recommend PMD f/u.    Richardean Canal, MD 01/01/14 1047

## 2014-02-07 ENCOUNTER — Emergency Department (HOSPITAL_COMMUNITY)
Admission: EM | Admit: 2014-02-07 | Discharge: 2014-02-07 | Disposition: A | Discharge status: Home / Self Care | Payer: Managed Care, Other (non HMO) | Attending: Emergency Medicine | Admitting: Emergency Medicine

## 2014-02-07 ENCOUNTER — Encounter (HOSPITAL_COMMUNITY): Payer: Self-pay | Admitting: Emergency Medicine

## 2014-02-07 DIAGNOSIS — R319 Hematuria, unspecified: Secondary | ICD-10-CM | POA: Diagnosis not present

## 2014-02-07 DIAGNOSIS — H109 Unspecified conjunctivitis: Secondary | ICD-10-CM | POA: Diagnosis not present

## 2014-02-07 DIAGNOSIS — K4090 Unilateral inguinal hernia, without obstruction or gangrene, not specified as recurrent: Secondary | ICD-10-CM | POA: Insufficient documentation

## 2014-02-07 DIAGNOSIS — J45909 Unspecified asthma, uncomplicated: Secondary | ICD-10-CM | POA: Insufficient documentation

## 2014-02-07 DIAGNOSIS — R51 Headache: Secondary | ICD-10-CM | POA: Diagnosis not present

## 2014-02-07 DIAGNOSIS — M5441 Lumbago with sciatica, right side: Secondary | ICD-10-CM | POA: Insufficient documentation

## 2014-02-07 DIAGNOSIS — Z79899 Other long term (current) drug therapy: Secondary | ICD-10-CM | POA: Insufficient documentation

## 2014-02-07 LAB — COMPREHENSIVE METABOLIC PANEL
ALBUMIN: 4.1 g/dL (ref 3.5–5.2)
ALK PHOS: 60 U/L (ref 39–117)
ALT: 33 U/L (ref 0–53)
AST: 31 U/L (ref 0–37)
Anion gap: 10 (ref 5–15)
BUN: 9 mg/dL (ref 6–23)
CO2: 27 mEq/L (ref 19–32)
Calcium: 9.4 mg/dL (ref 8.4–10.5)
Chloride: 104 mEq/L (ref 96–112)
Creatinine, Ser: 1.06 mg/dL (ref 0.50–1.35)
GFR calc Af Amer: 89 mL/min — ABNORMAL LOW (ref >90)
GFR calc non Af Amer: 77 mL/min — ABNORMAL LOW (ref >90)
GLUCOSE: 94 mg/dL (ref 70–99)
POTASSIUM: 4 meq/L (ref 3.7–5.3)
SODIUM: 141 meq/L (ref 137–147)
TOTAL PROTEIN: 7.6 g/dL (ref 6.0–8.3)
Total Bilirubin: 0.8 mg/dL (ref 0.3–1.2)

## 2014-02-07 LAB — CBC WITH DIFFERENTIAL/PLATELET
Basophils Absolute: 0 10*3/uL (ref 0.0–0.1)
Basophils Relative: 0 % (ref 0–1)
EOS ABS: 0.2 10*3/uL (ref 0.0–0.7)
Eosinophils Relative: 3 % (ref 0–5)
HCT: 42.9 % (ref 39.0–52.0)
Hemoglobin: 14.3 g/dL (ref 13.0–17.0)
LYMPHS ABS: 1.6 10*3/uL (ref 0.7–4.0)
Lymphocytes Relative: 29 % (ref 12–46)
MCH: 26.7 pg (ref 26.0–34.0)
MCHC: 33.3 g/dL (ref 30.0–36.0)
MCV: 80.2 fL (ref 78.0–100.0)
Monocytes Absolute: 0.4 10*3/uL (ref 0.1–1.0)
Monocytes Relative: 7 % (ref 3–12)
Neutro Abs: 3.3 10*3/uL (ref 1.7–7.7)
Neutrophils Relative %: 61 % (ref 43–77)
Platelets: 243 10*3/uL (ref 150–400)
RBC: 5.35 MIL/uL (ref 4.22–5.81)
RDW: 13.3 % (ref 11.5–15.5)
WBC: 5.4 10*3/uL (ref 4.0–10.5)

## 2014-02-07 LAB — URINALYSIS, ROUTINE W REFLEX MICROSCOPIC
BILIRUBIN URINE: NEGATIVE
Glucose, UA: NEGATIVE mg/dL
Hgb urine dipstick: NEGATIVE
Ketones, ur: NEGATIVE mg/dL
Leukocytes, UA: NEGATIVE
NITRITE: NEGATIVE
Protein, ur: NEGATIVE mg/dL
SPECIFIC GRAVITY, URINE: 1.018 (ref 1.005–1.030)
Urobilinogen, UA: 0.2 mg/dL (ref 0.0–1.0)
pH: 7 (ref 5.0–8.0)

## 2014-02-07 LAB — SEDIMENTATION RATE: Sed Rate: 2 mm/hr (ref 0–16)

## 2014-02-07 LAB — C-REACTIVE PROTEIN: CRP: <0.5 mg/dL — ABNORMAL LOW (ref <0.60)

## 2014-02-07 MED ORDER — KETOROLAC TROMETHAMINE 30 MG/ML IJ SOLN: 30.0000 mg | Freq: Once | INTRAMUSCULAR | Status: DC

## 2014-02-07 MED ORDER — HYDROMORPHONE HCL 1 MG/ML IJ SOLN
0.5000 mg | Freq: Once | INTRAMUSCULAR | Status: AC
  Administered 2014-02-07: 0.5 mg via INTRAMUSCULAR
  Filled 2014-02-07: qty 1

## 2014-02-07 MED ORDER — ACETAMINOPHEN 500 MG PO TABS
1000.0000 mg | ORAL_TABLET | Freq: Once | ORAL | Status: AC
  Administered 2014-02-07: 1000 mg via ORAL
  Filled 2014-02-07: qty 2

## 2014-02-07 MED ORDER — TETRACAINE HCL 0.5 % OP SOLN
1.0000 [drp] | Freq: Once | OPHTHALMIC | Status: AC
  Administered 2014-02-07: 1 [drp] via OPHTHALMIC
  Filled 2014-02-07: qty 2

## 2014-02-07 MED ORDER — MORPHINE SULFATE 4 MG/ML IJ SOLN: 4.0000 mg | Freq: Once | INTRAMUSCULAR | Status: DC

## 2014-02-07 MED ORDER — FLUORESCEIN SODIUM 1 MG OP STRP
1.0000 | ORAL_STRIP | Freq: Once | OPHTHALMIC | Status: AC
  Administered 2014-02-07: 1 via OPHTHALMIC
  Filled 2014-02-07: qty 1

## 2014-02-07 NOTE — ED Notes (Signed)
Pt states lower back pain for several days, reports onset of hematuria this morning. Also states R eye redness and pain. Pt is alert and oriented x4. No signs of distress noted.

## 2014-02-07 NOTE — ED Provider Notes (Signed)
CSN: [id number]     Arrival date & time 02/07/14  5093 History   First MD Initiated Contact with Patient 02/07/14 1010     Chief Complaint  Patient presents with  . Hematuria  . Facial Pain    HPI Comments: 53 y.o. male with a past medical history of asthma, seasonal allergies. Presents for multiple concerns today. 1. Hematuria - began this morning, beginning of stream, resolved by mid stream, no dysuria; this has recurred weekly for the last 4 months; 2. Low back pain - began 2 weeks ago, ache, bilateral lower back radiating down right posterior leg, no urinary retention/incontinence, no bowel incontinence, no peri-anal numbness, no difficulty walking; 3. Left groin pain - worse with movement of his left leg, aching, denies penile pain/testicular pain; 4. Right eye redness and foreign body sensation - began 3-4 days ago, no trauma, associated with right sided facial headache "like a migraine".   Patient is a 53 y.o. male presenting with hematuria. The history is provided by the patient.  Hematuria This is a new problem. The current episode started today. Episode frequency: once. The problem has been resolved. Associated symptoms include headaches. Pertinent negatives include no abdominal pain, change in bowel habit, chest pain, chills, coughing, fever, joint swelling, nausea, rash, urinary symptoms or vomiting. Nothing aggravates the symptoms. He has tried nothing for the symptoms. Improvement on treatment: n/a.    Past Medical History  Diagnosis Date  . Seasonal allergies   . Asthma    Past Surgical History  Procedure Laterality Date  . Ankle surgery Right    History reviewed. No pertinent family history. History  Substance Use Topics  . Smoking status: Never Smoker   . Smokeless tobacco: Not on file  . Alcohol Use: No    Review of Systems  Constitutional: Negative for fever and chills.  Eyes: Positive for redness. Negative for visual disturbance.  Respiratory: Negative for  cough.   Cardiovascular: Negative for chest pain.  Gastrointestinal: Negative for nausea, vomiting, abdominal pain and change in bowel habit.  Genitourinary: Positive for hematuria. Negative for penile swelling, scrotal swelling, penile pain and testicular pain.  Musculoskeletal: Negative for joint swelling.  Skin: Negative for rash.  Neurological: Positive for headaches.  All other systems reviewed and are negative.   Allergies  Clotrimazole and Tramadol  Home Medications   Prior to Admission medications   Medication Sig Start Date End Date Taking? Authorizing Provider  clindamycin (CLEOCIN) 150 MG capsule Take 1 capsule (150 mg total) by mouth every 6 (six) hours. 12/31/13   Noland Fordyce, PA-C  hydrOXYzine (ATARAX/VISTARIL) 25 MG tablet Take 1 tablet (25 mg total) by mouth every 6 (six) hours as needed for itching. 12/31/13   Noland Fordyce, PA-C  loratadine (CLARITIN) 10 MG tablet Take 1 tablet (10 mg total) by mouth daily. 08/08/11 12/31/13  Trisha Mangle, MD  Multiple Vitamin (MULTIVITAMIN WITH MINERALS) TABS tablet Take 1 tablet by mouth daily.    Historical Provider, MD  predniSONE (DELTASONE) 20 MG tablet 3 tabs po day one, then 2 po daily x 4 days 12/31/13   Noland Fordyce, PA-C  traMADol (ULTRAM) 50 MG tablet Take 1 tablet (50 mg total) by mouth every 6 (six) hours as needed. 12/31/13   Noland Fordyce, PA-C   BP 146/82  Pulse 84  Temp(Src) 98.3 F (36.8 C) (Oral)  Resp 20  SpO2 99%  Physical Exam  Vitals reviewed. Constitutional: He is oriented to person, place, and time. He appears well-developed and  well-nourished. No distress.  HENT:  Head: Normocephalic and atraumatic.  Right Ear: External ear normal.  Left Ear: External ear normal.  Mouth/Throat: Oropharynx is clear and moist.  Eyes: EOM are normal. Pupils are equal, round, and reactive to light. Right conjunctiva is injected.  Slit lamp exam:      The right eye shows no corneal abrasion, no corneal ulcer, no hyphema and  no fluorescein uptake.  Photophobia noted on pupillary exam; Pressures in right eye 21-15-16, left eye 15-16-16  Neck: Normal range of motion.  Cardiovascular: Normal rate and regular rhythm.   Pulmonary/Chest: Effort normal and breath sounds normal. No respiratory distress. He has no wheezes. He has no rales.  Abdominal: Soft. He exhibits no distension. There is no tenderness. There is no rebound and no guarding. A hernia is present. Hernia confirmed positive in the left inguinal area (2.5cm round inguinal defect; bowel moves freely back and forth, no incarceration or strangulation of bowel).  Genitourinary: Testes normal. Rectal exam shows anal tone normal. Right testis shows no mass, no swelling and no tenderness. Right testis is descended. Left testis shows no mass, no swelling and no tenderness. Left testis is descended. Uncircumcised.  Chaperone present  Neurological: He is alert and oriented to person, place, and time. Gait normal.  Normal dorsiflexion and plantar flexion on bilateral lower extremities  Skin: Skin is warm and dry. No rash noted. He is not diaphoretic.  Psychiatric: He has a normal mood and affect.    ED Course  Procedures   Labs Review  Results for orders placed during the hospital encounter of 02/07/14  URINALYSIS, ROUTINE W REFLEX MICROSCOPIC      Result Value Ref Range   Color, Urine YELLOW  YELLOW   APPearance CLEAR  CLEAR   Specific Gravity, Urine 1.018  1.005 - 1.030   pH 7.0  5.0 - 8.0   Glucose, UA NEGATIVE  NEGATIVE mg/dL   Hgb urine dipstick NEGATIVE  NEGATIVE   Bilirubin Urine NEGATIVE  NEGATIVE   Ketones, ur NEGATIVE  NEGATIVE mg/dL   Protein, ur NEGATIVE  NEGATIVE mg/dL   Urobilinogen, UA 0.2  0.0 - 1.0 mg/dL   Nitrite NEGATIVE  NEGATIVE   Leukocytes, UA NEGATIVE  NEGATIVE  CBC WITH DIFFERENTIAL      Result Value Ref Range   WBC 5.4  4.0 - 10.5 K/uL   RBC 5.35  4.22 - 5.81 MIL/uL   Hemoglobin 14.3  13.0 - 17.0 g/dL   HCT 42.9  39.0 - 52.0 %    MCV 80.2  78.0 - 100.0 fL   MCH 26.7  26.0 - 34.0 pg   MCHC 33.3  30.0 - 36.0 g/dL   RDW 13.3  11.5 - 15.5 %   Platelets 243  150 - 400 K/uL   Neutrophils Relative % 61  43 - 77 %   Neutro Abs 3.3  1.7 - 7.7 K/uL   Lymphocytes Relative 29  12 - 46 %   Lymphs Abs 1.6  0.7 - 4.0 K/uL   Monocytes Relative 7  3 - 12 %   Monocytes Absolute 0.4  0.1 - 1.0 K/uL   Eosinophils Relative 3  0 - 5 %   Eosinophils Absolute 0.2  0.0 - 0.7 K/uL   Basophils Relative 0  0 - 1 %   Basophils Absolute 0.0  0.0 - 0.1 K/uL  COMPREHENSIVE METABOLIC PANEL      Result Value Ref Range   Sodium 141  137 -  147 mEq/L   Potassium 4.0  3.7 - 5.3 mEq/L   Chloride 104  96 - 112 mEq/L   CO2 27  19 - 32 mEq/L   Glucose, Bld 94  70 - 99 mg/dL   BUN 9  6 - 23 mg/dL   Creatinine, Ser 1.06  0.50 - 1.35 mg/dL   Calcium 9.4  8.4 - 10.5 mg/dL   Total Protein 7.6  6.0 - 8.3 g/dL   Albumin 4.1  3.5 - 5.2 g/dL   AST 31  0 - 37 U/L   ALT 33  0 - 53 U/L   Alkaline Phosphatase 60  39 - 117 U/L   Total Bilirubin 0.8  0.3 - 1.2 mg/dL   GFR calc non Af Amer 77 (*) >90 mL/min   GFR calc Af Amer 89 (*) >90 mL/min   Anion gap 10  5 - 15  SEDIMENTATION RATE      Result Value Ref Range   Sed Rate 2  0 - 16 mm/hr    MDM   Final diagnoses:  Hematuria  Conjunctivitis of right eye  Bilateral low back pain with right-sided sciatica   Regarding his painless hematuria: No hematuria on urinalysis today; no infection; presentation not consistent with kidney stone; will have him follow up with urology given his recurrence of hematuria - concern for bladder mass; contact numbers given   Regarding his low back pain: Worse on the right as compared to the left; No bowel/bladder incontinence, no urinary retention, no peri-anal or saddle anesthesia, no difficulty ambulating or lower extremity weakness; pain/tenderness is over right and left iliac crests; normal rectal exam; no concern for cauda equina or spinal cord compression; will  have him establish with a primary care physician, resource guide given  Regarding his left groin pain: Palpable defect in the left inguinal region; no evidence of bowel incarceration or strangulation; will have him follow up with primary care; resources given; no testicular pain, tenderness or swelling  Regarding his right eye redness / associated headache: No history of trauma; normal IOP, no concern for acute angle closure glaucoma; no fluorescein uptake, no concern for corneal abrasion, herpetic keratitis, traumatic iritis; ESR normal, no concern for temporal arteritis; recommend lubricating eye drops and follow up with opthalmology, contact information given; on re-evaluation the patient is feeling "100% better" after a dose of IV pain medication  Strict return precautions discussed and given in writing. He is to follow up with Urology, Ophthalmology, and a PCP.   This case managed in conjunction with my attending, Dr. Johnney Killian.     Berenice Primas, MD 02/07/14 [phone]

## 2014-02-07 NOTE — ED Notes (Signed)
Lower back pain; blood in urine this morning; trouble sitting up due to pain x 1 week. Pt states right face pain "like he has a tooth ache" Feels like something in right eye at times. Swelling to right side mild.

## 2014-02-07 NOTE — ED Notes (Signed)
Dr.Batista at bedside  

## 2014-02-07 NOTE — ED Notes (Signed)
Pt comfortable with discharge and follow up instructions. Declines wheelchair, escorted to waiting area. No prescriptions.

## 2014-02-07 NOTE — Discharge Instructions (Signed)
°Emergency Department Resource Guide °1) Find a Doctor and Pay Out of Pocket °Although you won't have to find out who is covered by your insurance plan, it is a good idea to ask around and get recommendations. You will then need to call the office and see if the doctor you have chosen will accept you as a new patient and what types of options they offer for patients who are self-pay. Some doctors offer discounts or will set up payment plans for their patients who do not have insurance, but you will need to ask so you aren't surprised when you get to your appointment. ° °2) Contact Your Local Health Department °Not all health departments have doctors that can see patients for sick visits, but many do, so it is worth a call to see if yours does. If you don't know where your local health department is, you can check in your phone book. The CDC also has a tool to help you locate your state's health department, and many state websites also have listings of all of their local health departments. ° °3) Find a Walk-in Clinic °If your illness is not likely to be very severe or complicated, you may want to try a walk in clinic. These are popping up all over the country in pharmacies, drugstores, and shopping centers. They're usually staffed by nurse practitioners or physician assistants that have been trained to treat common illnesses and complaints. They're usually fairly quick and inexpensive. However, if you have serious medical issues or chronic medical problems, these are probably not your best option. ° °No Primary Care Doctor: °- Call Health Connect at  832-8000 - they can help you locate a primary care doctor that  accepts your insurance, provides certain services, etc. °- Physician Referral Service- 1-800-533-3463 ° °Chronic Pain Problems: °Organization         Address  Phone   Notes  °[facility]  (336) 297-2271 Patients need to be referred by their primary care doctor.  ° °Medication  Assistance: °Organization         Address  Phone   Notes  °[place] [facility] [address] °[place], [place] [address] (336) 641-8030 --Must be a resident of [place] °-- Must have NO insurance coverage whatsoever (no Medicaid/ Medicare, etc.) °-- The pt. MUST have a primary care doctor that directs their care regularly and follows them in the community °  °[facility]  (866) 331-1348   °[employer]  (888) 892-1162   ° °Agencies that provide inexpensive medical care: °Organization         Address  Phone   Notes  °[facility]  (336) 832-8035   °[facility]    (336) 832-7272   °[facility] [address] °[place], [place] [address] (336) 832-4777   °[facility] [address], °[place] (336) 271-4999   °[facility]    (336) 373-0678   °[facility]    (336) 272-1050   °[facility] ° [address], [place] Phone:  (336) 832-4444, Fax:  (336) 832-4440 Hours of Operation:  9 am - 6 pm, M-F.  Also accepts Medicaid/Medicare and self-pay.  °[facility] ° [address], [place] Phone: (336) 832-3150, Fax: (336) [phone]. Hours of Operation:  8:30 am - 5:30 pm, M-F.  Also accepts Medicaid and self-pay.  °[facility] [place] [address], [place] Phone: (336) 878-6027   °[facility] [address], [place], [place] (336)723-1848, Ext. 123 Mondays & Thursdays: 7-9 AM.  First 15 patients are seen on a first come, first serve basis. °  ° °Medicaid-accepting [place] Providers: ° °Organization         Address  Phone   Notes  °[facility] [address], [place] (336) 641-2100 Also accepts self-pay patients.  °[facility] [address], [place] ° (336) 856-9996   °[facility] [address], [place]  (336) 288-8857   °[facility] [address] [place] [address], [place] (336) 299-7000   °Veita Bland [address], [place]  ° (336) 373-1557 Only accepts [place] Access Medicaid patients after they have their name applied to their card.  ° °Self-Pay (no insurance) in [place]: ° °Organization         Address  Phone   Notes  °Sickle Cell Patients, [facility] [address], [place] (336) 832-1970   °[facility] [address], [place] (336) 832-4400   °[facility] ° [address], [place] (336) 992-4800   °[facility]/Dr. Osei-Bonsu ° [address] [place] [address], [place] or [address], [place] (336) 841-8500 Phone number for both [place] and [place] locations is the same.  °[facility] [address], [place] (336) 299-0000   °[facility] [address] [place] [address], [place] or [address] (336) 852-7530 °(336) 878-2260   °[facility] [address], [place] (336) 350-1642, phone; (336) 294-5005, fax Sees patients 1st and 3rd Saturday of every month.  Must not qualify for public or private insurance (i.e. Medicaid, Medicare, [place] Health Choice, Veterans' Benefits) • Household income should be no more than 200% of the poverty level •The clinic cannot treat you if you are pregnant or think you are pregnant • Sexually transmitted diseases are not treated at the clinic.  ° ° °Dental Care: °Organization         Address  Phone  Notes  °[place] [facility] [address], [place] (336) 641-6152 Accepts children up to age 21 who are enrolled in Medicaid or [place] Health Choice; pregnant women with a Medicaid card; and children who have applied for Medicaid or [place] Health Choice, but were declined, whose parents can pay a reduced fee at time of service.  °[place]  [facility] [place]  [address], [place] (336) 641-7733 Accepts children up to age 21 who are enrolled in Medicaid or [place] Health Choice; pregnant women with a Medicaid card; and children who have applied for Medicaid or [place] Health Choice, but were declined, whose parents can pay a reduced fee at time of service.  °[facility] ° [address], [place] (336) 641-4533 Patients are seen by appointment only. Walk-ins are not accepted. [facility] will see patients 18 years of age and older. °Monday - Tuesday (8am-5pm) °Most Wednesdays (8:30-5pm) °$30 per visit, cash only  °[facility] ° [address], [place] (336) 641-4533 Patients are seen by appointment only. Walk-ins are not accepted. [facility] will see patients 18 years of age and older. °One   Wednesday Evening (Monthly: Volunteer Based).  $30 per visit, cash only  °[facility]  (919) 537-3737 for adults; Children under age 4, call Graduate Pediatric Dentistry at ([phone]) [phone]. Children aged 4-14, please call ([phone]) [phone] to request a pediatric application. ° Dental services are provided in all areas of dental care including fillings, crowns and bridges, complete and partial dentures, implants, gum treatment, root canals, and extractions. Preventive care is also provided. Treatment is provided to both adults and children. °Patients are selected via a lottery and there is often a waiting list. °  °[facility] [address], °[place] ° (336) 763-8833 [email or web address] °  °[facility] [address], [place], [place] (336)723-1848, Ext. 123 Second and Fourth Thursday of each month, opens at 6:30 AM; Clinic ends at 9 AM.  Patients are seen on a first-come first-served basis, and a limited number are seen during each clinic.  ° °[facility] ° [address], [place], [place] (336) 723-7904    Eligibility Requirements °You must have lived in [place], [place], or [place] counties for at least the last three months. °  You cannot be eligible for state or federal sponsored healthcare insurance, including Veterans Administration, Medicaid, or Medicare. °  You generally cannot be eligible for healthcare insurance through your employer.  °  How to apply: °Eligibility screenings are held every Tuesday and Wednesday afternoon from 1:00 pm until 4:00 pm. You do not need an appointment for the interview!  °[facility] [address], [place], [place] 336-631-2330   °[facility]  336-342-8273   °[facility]  336-703-3100   °[facility]  336-570-6415   ° °Behavioral Health Resources in the Community: °Intensive Outpatient Programs °Organization         Address  Phone  Notes  °[facility] [address], [place], [place] 336-878-6098   °[facility] [address], [place], [place] 336-832-9800   °[facility] [address], [place], [place] ° 336-882-2125   °[facility] [address],  °[place], [place] 1-800-853-5163 or 336-641-4981   °Substance Abuse Resources °Organization         Address  Phone  Notes  °[facility]  336-882-2125   °[facility]  336-784-9470   °[facility]  336-285-9073   °[facility]  336-845-3988   °Residential & Outpatient Substance Abuse Program  1-800-659-3381   °Psychological Services °Organization         Address  Phone  Notes  °[facility]  336- 832-9600   °[facility]  336- 378-7881   °[facility] [address], [place] 1-800-853-5163 or 336-641-4981   ° °Mobile Crisis Teams °Organization         Address  Phone  Notes  °[facility], Mobile Crisis Care Unit  1-877-626-1772   °Assertive °[facility] ° [address]  [place], [place] 336-834-9664   °Sharon DeEsch [address] °[place] 336-554-5454   ° °Self-Help/Support Groups °Organization         Address  Phone             Notes  °[facility] of  - variety of support groups  336- 373-1402 Call for more information  °Narcotics Anonymous (NA), [facility] [address], °[place]  2 meetings at this location  ° °  Residential Treatment Programs Organization         Address  Phone  Notes  [facility] [address],    [place]  [phone]   [facility]  [address], [place] [phone], [place], [place] [phone]   [facility] [address], [place] [phone] Admissions: 8am-3pm M-F  [facility] [address]    [place], [place] [phone]   [facility] [address], [place], [place] [phone]   [facility] [address]  [place], [place] [phone]   [facility] Intensive Outpatient [address], [place], [place] [phone]   [facility] ([facility] [address]  [place], [place] [phone] or [phone]   [facility] ([facility]) [address] [place], [place] [phone] Accepts Medicaid  [facility] [address]  [place] [phone] Substance Abuse/Addiction Treatment   [facility] Organization         Address  Phone  Notes  [facility]  [phone](888) ([phone]   Angie FavaJulie Brannon, PhD [address] [place], [place]   ([phone](336) [phone] or [phone](336) [phone]   [facility]   [address] [place], [place] [phone](336) [phone]   [facility] [address], [place], [place] [phone](336) [phone] Insurance/Medicaid/sponsorship through [facility] [address]                                    [place], [place] [phone]) [phone] Therapy/tele-psych/case    [facility] [address]   [place], [place] [phone]) [phone]    Dr. Lolly MustacheArfeen  [phone](336) [phone]   [facility]  [employer] [facility] 1) [address], [place] 2) [address] [place] [address], [place] 3)  [address], [place] [phone](336) [phone](336) [phone]  [phone](336) [phone]   [facility] Child Abuse Hotline (517)300-3703(336) 234-811-6825 or [phone](336) [phone] (After Hours)      Back Pain, Adult Low back pain is very common. About 1 in 5 people have back pain.The cause of low back pain is rarely dangerous. The pain often gets better over time.About half of people with a sudden onset of back pain feel better in just 2 weeks. About 8 in 10 people feel better by 6 weeks.  CAUSES Some common causes of back pain include:  Strain of the muscles or ligaments supporting the spine.  Wear and tear (degeneration) of the spinal discs.  Arthritis.  Direct injury to the back. DIAGNOSIS Most of the time, the direct cause of low back pain is not known.However, back pain can be treated effectively even when the exact cause of the pain is unknown.Answering your caregiver's questions about your overall health and symptoms is one of the most accurate ways to make sure the cause of your pain is not dangerous. If your caregiver needs more information, he or she may order lab work or imaging tests (X-rays or MRIs).However, even if imaging tests show changes in your back, this usually does not require surgery. HOME CARE INSTRUCTIONS For many people, back pain returns.Since low back pain is rarely dangerous, it is often a condition that people can learn to [facility] their own.   Remain active. It is stressful on the back to sit or stand in one place. Do  not sit, drive, or stand in one place for more than 30 minutes at a time. Take short walks on level surfaces as soon as pain allows.Try to increase the length of time you walk each day.  Do not stay in bed.Resting more than 1 or 2 days  can delay your recovery.  Do not avoid exercise or work.Your body is made to move.It is not dangerous to be active, even though your back may hurt.Your back will likely heal faster if you return to being active before your pain is gone.  Pay attention to your body when you bend and lift. Many people have less discomfortwhen lifting if they bend their knees, keep the load close to their bodies,and avoid twisting. Often, the most comfortable positions are those that put less stress on your recovering back.  Find a comfortable position to sleep. Use a firm mattress and lie on your side with your knees slightly bent. If you lie on your back, put a pillow under your knees.  Only take over-the-counter or prescription medicines as directed by your caregiver. Over-the-counter medicines to reduce pain and inflammation are often the most helpful.Your caregiver may prescribe muscle relaxant drugs.These medicines help dull your pain so you can more quickly return to your normal activities and healthy exercise.  Put ice on the injured area.  Put ice in a plastic bag.  Place a towel between your skin and the bag.  Leave the ice on for 15-20 minutes, 03-04 times a day for the first 2 to 3 days. After that, ice and heat may be alternated to reduce pain and spasms.  Ask your caregiver about trying back exercises and gentle massage. This may be of some benefit.  Avoid feeling anxious or stressed.Stress increases muscle tension and can worsen back pain.It is important to recognize when you are anxious or stressed and learn ways to manage it.Exercise is a great option. SEEK MEDICAL CARE IF:  You have pain that is not relieved with rest or medicine.  You have pain that does not improve in 1 week.  You have new symptoms.  You are generally not feeling well. SEEK IMMEDIATE MEDICAL CARE IF:   You have pain that radiates from your back into your legs.  You develop new bowel or bladder control  problems.  You have unusual weakness or numbness in your arms or legs.  You develop nausea or vomiting.  You develop abdominal pain.  You feel faint. Document Released: 03/27/2005 Document Revised: 09/26/2011 Document Reviewed: 07/29/2013 [facility] Patient Information 2015 [place]. This information is not intended to replace advice given to you by your health care provider. Make sure you discuss any questions you have with your health care provider.   Viral Conjunctivitis Conjunctivitis is an irritation (inflammation) of the clear membrane that covers the white part of the eye (the conjunctiva). The irritation can also happen on the underside of the eyelids. Conjunctivitis makes the eye red or pink in color. This is what is commonly known as pink eye. Viral conjunctivitis can spread easily (contagious). CAUSES   Infection from virus on the surface of the eye.  Infection from the irritation or injury of nearby tissues such as the eyelids or cornea.  More serious inflammation or infection on the inside of the eye.  Other eye diseases.  The use of certain eye medications. SYMPTOMS  The normally white color of the eye or the underside of the eyelid is usually pink or red in color. The pink eye  is usually associated with irritation, tearing and some sensitivity to light. Viral conjunctivitis is often associated with a clear, watery discharge. If a discharge is present, there may also be some blurred vision in the affected eye. DIAGNOSIS  Conjunctivitis is diagnosed by an eye exam. The eye specialist looks for changes in the surface tissues of the eye which take on changes characteristic of the specific types of conjunctivitis. A sample of any discharge may be collected on a Q-Tip (sterile swap). The sample will be sent to a lab to see whether or not the inflammation is caused by bacterial or viral infection. TREATMENT  Viral conjunctivitis will not respond to medicines that kill  germs (antibiotics). Treatment is aimed at stopping a bacterial infection on top of the viral infection. The goal of treatment is to relieve symptoms (such as itching) with antihistamine drops or other eye medications.  HOME CARE INSTRUCTIONS   To ease discomfort, apply a cool, clean wash cloth to your eye for 10 to 20 minutes, 3 to 4 times a day.  Gently wipe away any drainage from the eye with a warm, wet washcloth or a cotton ball.  Wash your hands often with soap and use paper towels to dry.  Do not share towels or washcloths. This may spread the infection.  Change or wash your pillowcase every day.  You should not use eye make-up until the infection is gone.  Stop using contacts lenses. Ask your eye professional how to sterilize or replace them before using again. This depends on the type of contact lenses used.  Do not touch the edge of the eyelid with the eye drop bottle or ointment tube when applying medications to the affected eye. This will stop you from spreading the infection to the other eye or to others. SEEK IMMEDIATE MEDICAL CARE IF:   The infection has not improved within 3 days of beginning treatment.  A watery discharge from the eye develops.  Pain in the eye increases.  The redness is spreading.  Vision becomes blurred.  An oral temperature above 102 F (38.9 C) develops, or as your caregiver suggests.  Facial pain, redness or swelling develops.  Any problems that may be related to the prescribed medicine develop. MAKE SURE YOU:   Understand these instructions.  Will watch your condition.  Will get help right away if you are not doing well or get worse. Document Released: 03/27/2005 Document Revised: 06/19/2011 Document Reviewed: 11/14/2007 [facility] Patient Information 2015 [place], [place]. This information is not intended to replace advice given to you by your health care provider. Make sure you discuss any questions you have with your health care  provider.

## 2014-02-07 NOTE — ED Provider Notes (Signed)
I saw and evaluated the patient, reviewed the resident's note and I agree with the findings and plan.   EKG Interpretation None     Patient did present with several seemingly unrelated complaints. The ocular history is a four-day history starting with mild itching and foreign body sensation progressing to more pain at day 4. At this time however there is no abnormality in intraocular pressure. And sedimentation rate is not elevated. At this point angle closure glaucoma and temporal arteritis R of very low probability. Fluoroscopy seen staining did not show any uptake. At this time I do believe that a conjunctivitis most likely diagnosis.  Regarding the patient's hematuria, he initially had stated as an acute problem however when I interviewed the patient he described this happening intermittently on a nearly weekly basis for up to 4 months. At this point in time there is no blood present in the urine and abdominal examination is benign. I do feel he is safe for continued outpatient management and further evaluations urology.  Arby BarretteMarcy Jamarion [place], MD 02/07/14 [phone]

## 2014-02-07 NOTE — ED Notes (Addendum)
Pt also reports L sided groin pain, ongoing for several months.

## 2014-02-16 ENCOUNTER — Encounter: Payer: Self-pay | Admitting: Medical

## 2014-02-16 ENCOUNTER — Ambulatory Visit (INDEPENDENT_AMBULATORY_CARE_PROVIDER_SITE_OTHER): Payer: Managed Care, Other (non HMO) | Admitting: Medical

## 2014-02-16 VITALS — BP 112/80 | HR 80 | Temp 98.3°F | Resp 16 | Wt 238.0 lb

## 2014-02-16 DIAGNOSIS — M791 Myalgia, unspecified site: Secondary | ICD-10-CM

## 2014-02-16 DIAGNOSIS — J4520 Mild intermittent asthma, uncomplicated: Secondary | ICD-10-CM

## 2014-02-16 DIAGNOSIS — K4090 Unilateral inguinal hernia, without obstruction or gangrene, not specified as recurrent: Secondary | ICD-10-CM

## 2014-02-16 DIAGNOSIS — S56911A Strain of unspecified muscles, fascia and tendons at forearm level, right arm, initial encounter: Secondary | ICD-10-CM

## 2014-02-16 LAB — POCT URINALYSIS DIPSTICK
Bilirubin, UA: NEGATIVE
Blood, UA: NEGATIVE
GLUCOSE UA: NEGATIVE
KETONES UA: NEGATIVE
Leukocytes, UA: NEGATIVE
Nitrite, UA: NEGATIVE
Protein, UA: NEGATIVE
Spec Grav, UA: <=1.005
Urobilinogen, UA: NEGATIVE
pH, UA: 7

## 2014-02-16 MED ORDER — BECLOMETHASONE DIPROPIONATE 80 MCG/ACT IN AERS: 1.0000 | INHALATION_SPRAY | Freq: Two times a day (BID) | RESPIRATORY_TRACT | Status: DC

## 2014-02-16 MED ORDER — NAPROXEN 500 MG PO TABS: 500.0000 mg | ORAL_TABLET | Freq: Two times a day (BID) | ORAL | Status: DC

## 2014-02-16 MED ORDER — HYDROCODONE-ACETAMINOPHEN 5-325 MG PO TABS: 1.0000 | ORAL_TABLET | Freq: Four times a day (QID) | ORAL | Status: DC | PRN

## 2014-02-16 NOTE — Progress Notes (Signed)
Subjective: Here as a new patient today.   No recent primary care.   Here for ED f/u and to establish.  Went to the emergency dept 02/07/14 for multiple concerns.  Advised to f/u here  Has pain in left groin, ED said he had a hernia.  Questions about this.     Here for c/o almost a month hx/o right forearm pain.  He is left handed, works as a Geneticist, molecularmetal fabricator, but plays handball and racquetball regularly, and this pain started after intense racquetball 3-4 wk ago.  He denies fall, trauma, or other injury.  In the last 3 days, got 7-8 hours sleep total due to joint pains.   Gets pain in his right forearm.   Latera in the day gets worse.  At times pain som bad he doesn't want to close his hands.  No left arm pain.  Has pains in legs in general, hx/o achilles tendon rupture.  Uses NSAID OTC daily.  Hx/o visible blood in urine for the past 4 months that recently stopped out of the blue.   He is a nonsmoker.   Denies prostate problems.    Asthma - uses inhaler beginning of spring, in the fall.  Currently using albuterol 3 times per week.  No prior preventative inhalers.    ROS as in subjective  Objective: Filed Vitals:   02/16/14 1507  BP: 112/80  Pulse: 80  Temp: 98.3 F (36.8 C)  Resp: 16    General appearance: alert, no distress, WD/WN, AA male HEENT: normocephalic, sclerae anicteric, TMs pearly, nares patent, no discharge or erythema, pharynx normal Oral cavity: MMM, no lesions Neck: supple, no lymphadenopathy, no thyromegaly, no masses Heart: RRR, normal S1, S2, no murmurs Lungs: CTA bilaterally, no wheezes, rhonchi, or rales Abdomen: +bs, soft, non tender, non distended, no masses, no hepatomegaly, no splenomegaly Pulses: 2+ symmetric, upper and lower extremities, normal cap refill GU: normal male genitalia, circumcised, no mass, left direct hernia, small to medium, reducible, slight tenderness, otherwise no other hernia   Assessment: Encounter Diagnoses  Name Primary?  . Muscle  pain Yes  . Forearm strain, right, initial encounter   . Inguinal hernia, left   . Asthma, mild intermittent, uncomplicated    Plan: Muscle pain, forearm strain - advised 1-2 wk of rest, ice daily, begin Naprosyn BID and Hydrocodone prn for pain.  Recheck 2-3 wk.  Inguinal hernia - discussed diagnosis, exam findings, symptoms that would prompt surgical repair.  He declines surgery consult at this time.  Avoid excessive heavy lifting.  Asthma - begin Qvar for worse asthma control in spring and fall, c/t albuterol HFA prn.  Discussed proper use of medication, discussed difference between rescue and preventative medications, f/u.  Recheck if not improving in 3-4 wk.

## 2014-02-16 NOTE — Patient Instructions (Signed)
For forearm muscle strain  Begin Naprosyn twice daily for pain and inflammation  You may use the Hydrocodone pain medication as needed for worse pain  Use ice to the forearm 20 minutes 1-2 times daily  Rest the arm  For asthma  Begin Qvar inhaler 1 puff twice daily for asthma prevention in the spring and fall  Use the albuterol rescue inhaler as needed for wheezing, cough, tightness in chest  Inguinal Hernia, Adult Muscles help keep everything in the body in its proper place. But if a weak spot in the muscles develops, something can poke through. That is called a hernia. When this happens in the lower part of the belly (abdomen), it is called an inguinal hernia. (It takes its name from a part of the body in this region called the inguinal canal.) A weak spot in the wall of muscles lets some fat or part of the small intestine bulge through. An inguinal hernia can develop at any age. Men get them more often than women. CAUSES  In adults, an inguinal hernia develops over time.  It can be triggered by:  Suddenly straining the muscles of the lower abdomen.  Lifting heavy objects.  Straining to have a bowel movement. Difficult bowel movements (constipation) can lead to this.  Constant coughing. This may be caused by smoking or lung disease.  Being overweight.  Being pregnant.  Working at a job that requires long periods of standing or heavy lifting.  Having had an inguinal hernia before. One type can be an emergency situation. It is called a strangulated inguinal hernia. It develops if part of the small intestine slips through the weak spot and cannot get back into the abdomen. The blood supply can be cut off. If that happens, part of the intestine may die. This situation requires emergency surgery. SYMPTOMS  Often, a small inguinal hernia has no symptoms. It is found when a healthcare provider does a physical exam. Larger hernias usually have symptoms.   In adults, symptoms may  include:  A lump in the groin. This is easier to see when the person is standing. It might disappear when lying down.  In men, a lump in the scrotum.  Pain or burning in the groin. This occurs especially when lifting, straining or coughing.  A dull ache or feeling of pressure in the groin.  Signs of a strangulated hernia can include:  A bulge in the groin that becomes very painful and tender to the touch.  A bulge that turns red or purple.  Fever, nausea and vomiting.  Inability to have a bowel movement or to pass gas. DIAGNOSIS  To decide if you have an inguinal hernia, a healthcare provider will probably do a physical examination.  This will include asking questions about any symptoms you have noticed.  The healthcare provider might feel the groin area and ask you to cough. If an inguinal hernia is felt, the healthcare provider may try to slide it back into the abdomen.  Usually no other tests are needed. TREATMENT  Treatments can vary. The size of the hernia makes a difference. Options include:  Watchful waiting. This is often suggested if the hernia is small and you have had no symptoms.  No medical procedure will be done unless symptoms develop.  You will need to watch closely for symptoms. If any occur, contact your healthcare provider right away.  Surgery. This is used if the hernia is larger or you have symptoms.  Open surgery. This is  usually an outpatient procedure (you will not stay overnight in a hospital). An cut (incision) is made through the skin in the groin. The hernia is put back inside the abdomen. The weak area in the muscles is then repaired by herniorrhaphy or hernioplasty. Herniorrhaphy: in this type of surgery, the weak muscles are sewn back together. Hernioplasty: a patch or mesh is used to close the weak area in the abdominal wall.  Laparoscopy. In this procedure, a surgeon makes small incisions. A thin tube with a tiny video camera (called a  laparoscope) is put into the abdomen. The surgeon repairs the hernia with mesh by looking with the video camera and using two long instruments. HOME CARE INSTRUCTIONS   After surgery to repair an inguinal hernia:  You will need to take pain medicine prescribed by your healthcare provider. Follow all directions carefully.  You will need to take care of the wound from the incision.  Your activity will be restricted for awhile. This will probably include no heavy lifting for several weeks. You also should not do anything too active for a few weeks. When you can return to work will depend on the type of job that you have.  During "watchful waiting" periods, you should:  Maintain a healthy weight.  Eat a diet high in fiber (fruits, vegetables and whole grains).  Drink plenty of fluids to avoid constipation. This means drinking enough water and other liquids to keep your urine clear or pale yellow.  Do not lift heavy objects.  Do not stand for long periods of time.  Quit smoking. This should keep you from developing a frequent cough. SEEK MEDICAL CARE IF:   A bulge develops in your groin area.  You feel pain, a burning sensation or pressure in the groin. This might be worse if you are lifting or straining.  You develop a fever of more than 100.5 F (38.1 C). SEEK IMMEDIATE MEDICAL CARE IF:   Pain in the groin increases suddenly.  A bulge in the groin gets bigger suddenly and does not go down.  For men, there is sudden pain in the scrotum. Or, the size of the scrotum increases.  A bulge in the groin area becomes red or purple and is painful to touch.  You have nausea or vomiting that does not go away.  You feel your heart beating much faster than normal.  You cannot have a bowel movement or pass gas.  You develop a fever of more than 102.0 F (38.9 C). Document Released: 08/13/2008 Document Revised: 06/19/2011 Document Reviewed: 08/13/2008 [facility] Patient Information  2015 [place], [place]. This information is not intended to replace advice given to you by your health care provider. Make sure you discuss any questions you have with your health care provider.

## 2014-06-15 ENCOUNTER — Emergency Department (HOSPITAL_COMMUNITY)
Admission: EM | Admit: 2014-06-15 | Discharge: 2014-06-15 | Disposition: A | Discharge status: Home / Self Care | Payer: Managed Care, Other (non HMO) | Attending: Emergency Medicine | Admitting: Emergency Medicine

## 2014-06-15 ENCOUNTER — Encounter (HOSPITAL_COMMUNITY): Payer: Self-pay | Admitting: Emergency Medicine

## 2014-06-15 DIAGNOSIS — Z79899 Other long term (current) drug therapy: Secondary | ICD-10-CM | POA: Diagnosis not present

## 2014-06-15 DIAGNOSIS — H109 Unspecified conjunctivitis: Secondary | ICD-10-CM | POA: Diagnosis not present

## 2014-06-15 DIAGNOSIS — Z791 Long term (current) use of non-steroidal anti-inflammatories (NSAID): Secondary | ICD-10-CM | POA: Diagnosis not present

## 2014-06-15 DIAGNOSIS — J45909 Unspecified asthma, uncomplicated: Secondary | ICD-10-CM | POA: Insufficient documentation

## 2014-06-15 DIAGNOSIS — Z7951 Long term (current) use of inhaled steroids: Secondary | ICD-10-CM | POA: Diagnosis not present

## 2014-06-15 DIAGNOSIS — H5713 Ocular pain, bilateral: Secondary | ICD-10-CM | POA: Diagnosis present

## 2014-06-15 MED ORDER — FLUORESCEIN SODIUM 1 MG OP STRP
ORAL_STRIP | OPHTHALMIC | Status: AC
  Administered 2014-06-15: 15:00:00
  Filled 2014-06-15: qty 1

## 2014-06-15 MED ORDER — TETRACAINE HCL 0.5 % OP SOLN
2.0000 [drp] | Freq: Once | OPHTHALMIC | Status: AC
  Administered 2014-06-15: 2 [drp] via OPHTHALMIC
  Filled 2014-06-15: qty 2

## 2014-06-15 MED ORDER — FLUORESCEIN SODIUM 1 MG OP STRP: 1.0000 | ORAL_STRIP | Freq: Once | OPHTHALMIC | Status: DC

## 2014-06-15 MED ORDER — ERYTHROMYCIN 5 MG/GM OP OINT: TOPICAL_OINTMENT | OPHTHALMIC | Status: DC

## 2014-06-15 MED ORDER — HYDROCODONE-ACETAMINOPHEN 5-325 MG PO TABS
1.0000 | ORAL_TABLET | Freq: Four times a day (QID) | ORAL | Status: DC | PRN
Start: 2014-06-15 — End: 2015-07-29

## 2014-06-15 MED ORDER — OLOPATADINE HCL 0.2 % OP SOLN: 1.0000 [drp] | Freq: Every day | OPHTHALMIC | Status: DC

## 2014-06-15 MED ORDER — FLUORESCEIN SODIUM 1 MG OP STRP
1.0000 | ORAL_STRIP | Freq: Once | OPHTHALMIC | Status: AC
  Administered 2014-06-15: 1 via OPHTHALMIC
  Filled 2014-06-15: qty 1

## 2014-06-15 NOTE — ED Notes (Signed)
Pt c/o bil eye redness and burning with some nasal congestion

## 2014-06-15 NOTE — ED Provider Notes (Signed)
CSN: [id number]     Arrival date & time 06/15/14  1158 History   First MD Initiated Contact with Patient 06/15/14 1240     Chief Complaint  Patient presents with  . Conjunctivitis     (Consider location/radiation/quality/duration/timing/severity/associated sxs/prior Treatment) HPI Curtis Savage is a 54 y.o. male with history of bad seasonal allergies, presents to emergency department complaining of bilateral eye pain, itching, redness, draining. States started after spending the day outside 2 days ago, and clicking out. He denies any injuries to his eyes. He states he takes daily antihistamines including hydroxyzine, and using over-the-counter allergy eyedrops. Patient states he normally gets this every year, states he normally receives prescription for the myosin eyedrops, pain medication. He states he is having pressure behind bilateral eyes. He denies changes in vision. He states he is sensitive to light. Reports purulent drainage from both eyes.  Past Medical History  Diagnosis Date  . Seasonal allergies   . Asthma    Past Surgical History  Procedure Laterality Date  . Ankle surgery Right    History reviewed. No pertinent family history. History  Substance Use Topics  . Smoking status: Never Smoker   . Smokeless tobacco: Not on file  . Alcohol Use: No    Review of Systems  Constitutional: Negative for fever and chills.  HENT: Positive for congestion. Negative for sore throat.   Eyes: Positive for photophobia, pain, discharge, redness and itching. Negative for visual disturbance.  Respiratory: Negative for cough, chest tightness and shortness of breath.   Cardiovascular: Negative for chest pain, palpitations and leg swelling.  Musculoskeletal: Negative for myalgias, arthralgias, neck pain and neck stiffness.  Skin: Negative for rash.  Neurological: Negative for dizziness, light-headedness and headaches.      Allergies  Tomato; Tramadol; and Clotrimazole  Home  Medications   Prior to Admission medications   Medication Sig Start Date End Date Taking? Authorizing Provider  albuterol (PROVENTIL HFA;VENTOLIN HFA) 108 (90 BASE) MCG/ACT inhaler Inhale 2 puffs into the lungs every 6 (six) hours as needed for wheezing or shortness of breath.    Historical Provider, MD  beclomethasone (QVAR) 80 MCG/ACT inhaler Inhale 1 puff into the lungs 2 (two) times daily. 02/16/14   Kermit Baloavid S Tysinger, PA-C  erythromycin ophthalmic ointment Place a 1/2 inch ribbon of ointment into the lower eyelid. 06/15/14   Aylla Huffine, PA-C  HYDROcodone-acetaminophen (NORCO) 5-325 MG per tablet Take 1 tablet by mouth every 6 (six) hours as needed. 06/15/14   Alayza Pieper, PA-C  hydrOXYzine (ATARAX/VISTARIL) 25 MG tablet Take 1 tablet (25 mg total) by mouth every 6 (six) hours as needed for itching. 12/31/13   Junius FinnerErin O'Malley, PA-C  loratadine (CLARITIN) 10 MG tablet Take 1 tablet (10 mg total) by mouth daily. 08/08/11 02/16/14  Dayton BailiffAndrew King, MD  Multiple Vitamin (MULTIVITAMIN WITH MINERALS) TABS tablet Take 1 tablet by mouth daily.    Historical Provider, MD  Naphazoline HCl (CLEAR EYES OP) Place 2 drops into both eyes 2 (two) times daily.    Historical Provider, MD  naproxen (NAPROSYN) 500 MG tablet Take 1 tablet (500 mg total) by mouth 2 (two) times daily with a meal. 02/16/14   Kermit Baloavid S Tysinger, PA-C  Olopatadine HCl (PATADAY) 0.2 % SOLN Apply 1 drop to eye daily. 06/15/14   Deasiah Hagberg, PA-C   BP 127/84 mmHg  Pulse 80  Temp(Src) 97.9 F (36.6 C) (Oral)  Resp 16  Ht 5\' 11"  (1.803 m)  Wt 242 lb (109.77 kg)  BMI  33.77 kg/m2  SpO2 100% Physical Exam  Constitutional: He is oriented to person, place, and time. He appears well-developed and well-nourished. No distress.  HENT:  Head: Normocephalic and atraumatic.  Right Ear: External ear normal.  Left Ear: External ear normal.  Nose: Nose normal.  Eyes: EOM are normal. Pupils are equal, round, and reactive to light. Right eye  exhibits discharge. No foreign body present in the right eye. Left eye exhibits discharge. No foreign body present in the left eye. Right conjunctiva is injected. Left conjunctiva is injected.  Fundoscopic exam:      The left eye shows no papilledema.  Slit lamp exam:      The right eye shows no corneal abrasion, no corneal ulcer, no foreign body, no hyphema and no hypopyon.       The left eye shows no corneal abrasion, no corneal ulcer, no foreign body, no hyphema and no hypopyon.  Neck: Neck supple.  Cardiovascular: Normal rate, regular rhythm and normal heart sounds.   Pulmonary/Chest: Effort normal. No respiratory distress. He has no wheezes. He has no rales.  Abdominal: Soft. Bowel sounds are normal.  Musculoskeletal: He exhibits no edema.  Neurological: He is alert and oriented to person, place, and time.  Skin: Skin is warm and dry.  Nursing note and vitals reviewed.   ED Course  Procedures (including critical care time) Labs Review Labs Reviewed - No data to display  Imaging Review No results found.   EKG Interpretation None      MDM   Final diagnoses:  Bilateral conjunctivitis   Visual acuity 20/40 in both eyes, 20/30 with glasses. Bilateral conjunctival injection, otherwise normal examination. Measured pressures which are averaged at 10 in the left eye, 16 in the right eye. Fluorescein stain negative. Most likely viral versus bacterial conjunctivitis. Doubt to uveitis at this time. I will discharge him home with erythromycin ophthalmic ointment, Pataday drops, will give him Norco for severe pain, follow-up with primary care doctor and his ophthalmologist.  Filed Vitals:   06/15/14 1200 06/15/14 1455  BP: 126/71 127/84  Pulse: 80 80  Temp: 98.6 F (37 C) 97.9 F (36.6 C)  TempSrc:  Oral  Resp: 16 16  Height:  (1.803 m)   Weight: 242 lb (109.77 kg)   SpO2: 100% 100%      Jaynie Crumble, PA-C 06/15/14 1623  Eber Hong, MD 06/15/14 2027

## 2014-06-15 NOTE — Discharge Instructions (Signed)
Continue your current allergy medications. Start applying erythromycin topically as prescribed. Use pataday drops daily. Norco for pain. Follow up with your eye doctor this week for recheck.    Conjunctivitis Conjunctivitis is commonly called "pink eye." Conjunctivitis can be caused by bacterial or viral infection, allergies, or injuries. There is usually redness of the lining of the eye, itching, discomfort, and sometimes discharge. There may be deposits of matter along the eyelids. A viral infection usually causes a watery discharge, while a bacterial infection causes a yellowish, thick discharge. Pink eye is very contagious and spreads by direct contact. You may be given antibiotic eyedrops as part of your treatment. Before using your eye medicine, remove all drainage from the eye by washing gently with warm water and cotton balls. Continue to use the medication until you have awakened 2 mornings in a row without discharge from the eye. Do not rub your eye. This increases the irritation and helps spread infection. Use separate towels from other household members. Wash your hands with soap and water before and after touching your eyes. Use cold compresses to reduce pain and sunglasses to relieve irritation from light. Do not wear contact lenses or wear eye makeup until the infection is gone. SEEK MEDICAL CARE IF:   Your symptoms are not better after 3 days of treatment.  You have increased pain or trouble seeing.  The outer eyelids become very red or swollen. Document Released: 05/04/2004 Document Revised: 06/19/2011 Document Reviewed: 03/27/2005 [facility] Patient Information 2015 [place]ExitCare, [place]LL[place]. This information is not intended to replace advice given to you by your health care provider. Make sure you discuss any questions you have with your health care provider.

## 2014-06-15 NOTE — ED Notes (Signed)
Declined W/C at D/C and was escorted to lobby by RN. 

## 2014-09-14 ENCOUNTER — Emergency Department (HOSPITAL_BASED_OUTPATIENT_CLINIC_OR_DEPARTMENT_OTHER)
Admission: EM | Admit: 2014-09-14 | Discharge: 2014-09-14 | Disposition: A | Discharge status: Home / Self Care | Payer: Managed Care, Other (non HMO) | Attending: Emergency Medicine | Admitting: Emergency Medicine

## 2014-09-14 ENCOUNTER — Emergency Department (HOSPITAL_COMMUNITY)
Admission: EM | Admit: 2014-09-14 | Discharge: 2014-09-14 | Disposition: A | Discharge status: Home / Self Care | Payer: Managed Care, Other (non HMO)

## 2014-09-14 ENCOUNTER — Encounter (HOSPITAL_BASED_OUTPATIENT_CLINIC_OR_DEPARTMENT_OTHER): Payer: Self-pay | Admitting: Emergency Medicine

## 2014-09-14 DIAGNOSIS — Z79899 Other long term (current) drug therapy: Secondary | ICD-10-CM | POA: Diagnosis not present

## 2014-09-14 DIAGNOSIS — J45909 Unspecified asthma, uncomplicated: Secondary | ICD-10-CM | POA: Diagnosis not present

## 2014-09-14 DIAGNOSIS — R21 Rash and other nonspecific skin eruption: Secondary | ICD-10-CM | POA: Diagnosis present

## 2014-09-14 DIAGNOSIS — L509 Urticaria, unspecified: Secondary | ICD-10-CM | POA: Insufficient documentation

## 2014-09-14 DIAGNOSIS — Z792 Long term (current) use of antibiotics: Secondary | ICD-10-CM | POA: Insufficient documentation

## 2014-09-14 DIAGNOSIS — Z7951 Long term (current) use of inhaled steroids: Secondary | ICD-10-CM | POA: Insufficient documentation

## 2014-09-14 DIAGNOSIS — J302 Other seasonal allergic rhinitis: Secondary | ICD-10-CM

## 2014-09-14 MED ORDER — HYDROXYZINE HCL 25 MG PO TABS: 25.0000 mg | ORAL_TABLET | Freq: Four times a day (QID) | ORAL | Status: DC | PRN

## 2014-09-14 MED ORDER — PREDNISONE 50 MG PO TABS: 50.0000 mg | ORAL_TABLET | Freq: Every day | ORAL | Status: DC

## 2014-09-14 NOTE — ED Provider Notes (Signed)
CSN: [id number]     Arrival date & time 09/14/14  1602 History  This chart was scribed for Jerelyn ScottMartha Linker, MD by Octavia HeirArianna Nassar, ED Scribe. This patient was seen in room MH12/MH12 and the patient's care was started at 5:11 PM.    Chief Complaint  Patient presents with  . Allergies      The history is provided by the patient. No language interpreter was used.    HPI Comments: Curtis Savage is a 54 y.o. male with PMHx of seasonal allergies and ashthma presents to the Emergency Department complaining of constant, gradual worsening allergies onset 2 days ago. Pt has associated itching, rash on chest and arms, and states his "face feels like someone slapped him with a brick". Pt notes putting hydrocortisone cream on his rash last night to alleviate Pt reports taking a benadryl about 11 hours ago to alleviate symptoms with some initial relief but symptoms recurred. Pt reports he takes Flonase for his allergies and Claritin occasionally. There are no other associated systemic symptoms, there are no other alleviating or modifying factors.   Past Medical History  Diagnosis Date  . Seasonal allergies   . Asthma    Past Surgical History  Procedure Laterality Date  . Ankle surgery Right    History reviewed. No pertinent family history. History  Substance Use Topics  . Smoking status: Never Smoker   . Smokeless tobacco: Not on file  . Alcohol Use: No    Review of Systems  HENT: Positive for sinus pressure.   Skin: Positive for rash.  All other systems reviewed and are negative.     Allergies  Tomato; Tramadol; and Clotrimazole  Home Medications   Prior to Admission medications   Medication Sig Start Date End Date Taking? Authorizing Provider  albuterol (PROVENTIL HFA;VENTOLIN HFA) 108 (90 BASE) MCG/ACT inhaler Inhale 2 puffs into the lungs every 6 (six) hours as needed for wheezing or shortness of breath.    Historical Provider, MD  beclomethasone (QVAR) 80 MCG/ACT inhaler Inhale 1  puff into the lungs 2 (two) times daily. 02/16/14   Kermit Baloavid S Tysinger, PA-C  erythromycin ophthalmic ointment Place a 1/2 inch ribbon of ointment into the lower eyelid. 06/15/14   Tatyana Kirichenko, PA-C  HYDROcodone-acetaminophen (NORCO) 5-325 MG per tablet Take 1 tablet by mouth every 6 (six) hours as needed. 06/15/14   Tatyana Kirichenko, PA-C  hydrOXYzine (ATARAX/VISTARIL) 25 MG tablet Take 1 tablet (25 mg total) by mouth every 6 (six) hours as needed for itching. 09/14/14   Jerelyn ScottMartha Linker, MD  loratadine (CLARITIN) 10 MG tablet Take 1 tablet (10 mg total) by mouth daily. 08/08/11 02/16/14  Dayton BailiffAndrew King, MD  Multiple Vitamin (MULTIVITAMIN WITH MINERALS) TABS tablet Take 1 tablet by mouth daily.    Historical Provider, MD  Naphazoline HCl (CLEAR EYES OP) Place 2 drops into both eyes 2 (two) times daily.    Historical Provider, MD  naproxen (NAPROSYN) 500 MG tablet Take 1 tablet (500 mg total) by mouth 2 (two) times daily with a meal. 02/16/14   Kermit Baloavid S Tysinger, PA-C  Olopatadine HCl (PATADAY) 0.2 % SOLN Apply 1 drop to eye daily. 06/15/14   Tatyana Kirichenko, PA-C  predniSONE (DELTASONE) 50 MG tablet Take 1 tablet (50 mg total) by mouth daily. 09/14/14   Jerelyn ScottMartha Linker, MD   Triage vitals: BP 109/88 mmHg  Pulse 68  Temp(Src) 98.4 F (36.9 C) (Oral)  Resp 18  Ht 5\' 11"  (1.803 m)  Wt 233 lb (105.688 kg)  BMI 32.51 kg/m2  SpO2 100% Vitals reviewed Physical Exam  Physical Examination: General appearance - alert, well appearing, and in no distress Mental status - alert, oriented to person, place, and time Eyes - no conjunctival injection, no scleral icterus Nose- swollen erythematous nasal turbinates Mouth - mucous membranes moist, pharynx normal without lesions, no lip or tongue swelling Chest - clear to auscultation, no wheezes, rales or rhonchi, symmetric air entry Heart - normal rate, regular rhythm, normal S1, S2, no murmurs, rubs, clicks or gallops Neurological - alert Extremities - peripheral  pulses normal, no pedal edema, no clubbing or cyanosis Skin - normal coloration and turgor, scattered small hives over arms  ED Course  Procedures ( DIAGNOSTIC STUDIES: Oxygen Saturation is 100% on RA, normal by my interpretation.  COORDINATION OF CARE:  5:15 PM Discussed treatment plan which includes hydroxyzine, Claritin and flonase  with pt at bedside and pt agreed to plan.   Labs Review Labs Reviewed - No data to display  Imaging Review No results found.   EKG Interpretation None      MDM   Final diagnoses:  Hives  Seasonal allergies    Pt presenting with c/o worsening allergies with nasal congestion and hives.  He took benadryl this morning which did help but did not redose.  No wheezing, lip or tongue swelling.  Pt is requesting short course of prednisone as this is what he received previously for similar symptoms.  Per chart review he did receive this last year.  Will also add rx for atarax.  Advised to take benadryl every 6 hours as this is short acting medication.  Discharged with strict return precautions.  Pt agreeable with plan.   I personally performed the services described in this documentation, which was scribed in my presence. The recorded information has been reviewed and is accurate.    Jerelyn Scott, MD 09/14/14 [phone]

## 2014-09-14 NOTE — ED Notes (Signed)
Pt states he has allergies but they are worse the last two days

## 2014-09-14 NOTE — Discharge Instructions (Signed)
Return to the ED with any concerns including difficulty breathing, lip or tongue swelling, vomiting, fainting, decreased level of alertness/lethargy, or any other alarming symptoms °

## 2014-09-14 NOTE — ED Notes (Signed)
Called no answer

## 2015-06-22 ENCOUNTER — Emergency Department (HOSPITAL_BASED_OUTPATIENT_CLINIC_OR_DEPARTMENT_OTHER): Payer: Managed Care, Other (non HMO)

## 2015-06-22 ENCOUNTER — Emergency Department (HOSPITAL_BASED_OUTPATIENT_CLINIC_OR_DEPARTMENT_OTHER)
Admission: EM | Admit: 2015-06-22 | Discharge: 2015-06-22 | Disposition: A | Discharge status: Home / Self Care | Payer: Managed Care, Other (non HMO) | Attending: Emergency Medicine | Admitting: Emergency Medicine

## 2015-06-22 ENCOUNTER — Encounter (HOSPITAL_BASED_OUTPATIENT_CLINIC_OR_DEPARTMENT_OTHER): Payer: Self-pay | Admitting: *Deleted

## 2015-06-22 DIAGNOSIS — Z7951 Long term (current) use of inhaled steroids: Secondary | ICD-10-CM | POA: Diagnosis not present

## 2015-06-22 DIAGNOSIS — J309 Allergic rhinitis, unspecified: Secondary | ICD-10-CM | POA: Insufficient documentation

## 2015-06-22 DIAGNOSIS — Z79899 Other long term (current) drug therapy: Secondary | ICD-10-CM | POA: Diagnosis not present

## 2015-06-22 DIAGNOSIS — Z791 Long term (current) use of non-steroidal anti-inflammatories (NSAID): Secondary | ICD-10-CM | POA: Diagnosis not present

## 2015-06-22 DIAGNOSIS — J45901 Unspecified asthma with (acute) exacerbation: Secondary | ICD-10-CM | POA: Insufficient documentation

## 2015-06-22 DIAGNOSIS — R0602 Shortness of breath: Secondary | ICD-10-CM | POA: Diagnosis present

## 2015-06-22 LAB — TROPONIN I: Troponin I: 0.03 ng/mL (ref <0.031)

## 2015-06-22 LAB — CBC WITH DIFFERENTIAL/PLATELET
BASOS ABS: 0.1 10*3/uL (ref 0.0–0.1)
Basophils Relative: 1 %
EOS ABS: 0.2 10*3/uL (ref 0.0–0.7)
EOS PCT: 4 %
HCT: 45.5 % (ref 39.0–52.0)
Hemoglobin: 15.1 g/dL (ref 13.0–17.0)
LYMPHS PCT: 30 %
Lymphs Abs: 1.7 10*3/uL (ref 0.7–4.0)
MCH: 26.8 pg (ref 26.0–34.0)
MCHC: 33.2 g/dL (ref 30.0–36.0)
MCV: 80.8 fL (ref 78.0–100.0)
MONO ABS: 0.5 10*3/uL (ref 0.1–1.0)
Monocytes Relative: 9 %
Neutro Abs: 3.3 10*3/uL (ref 1.7–7.7)
Neutrophils Relative %: 56 %
PLATELETS: 265 10*3/uL (ref 150–400)
RBC: 5.63 MIL/uL (ref 4.22–5.81)
RDW: 13.4 % (ref 11.5–15.5)
WBC: 5.9 10*3/uL (ref 4.0–10.5)

## 2015-06-22 LAB — BASIC METABOLIC PANEL
Anion gap: 9 (ref 5–15)
BUN: 9 mg/dL (ref 6–20)
CALCIUM: 9.1 mg/dL (ref 8.9–10.3)
CO2: 22 mmol/L (ref 22–32)
CREATININE: 1.22 mg/dL (ref 0.61–1.24)
Chloride: 107 mmol/L (ref 101–111)
GFR calc Af Amer: >60 mL/min (ref >60)
GFR calc non Af Amer: >60 mL/min (ref >60)
GLUCOSE: 90 mg/dL (ref 65–99)
Potassium: 3.8 mmol/L (ref 3.5–5.1)
Sodium: 138 mmol/L (ref 135–145)

## 2015-06-22 MED ORDER — PREDNISONE 20 MG PO TABS: 20.0000 mg | ORAL_TABLET | Freq: Two times a day (BID) | ORAL | Status: DC

## 2015-06-22 MED ORDER — IPRATROPIUM-ALBUTEROL 0.5-2.5 (3) MG/3ML IN SOLN
3.0000 mL | Freq: Once | RESPIRATORY_TRACT | Status: AC
  Administered 2015-06-22: 3 mL via RESPIRATORY_TRACT
  Filled 2015-06-22: qty 3

## 2015-06-22 MED ORDER — ALBUTEROL SULFATE (2.5 MG/3ML) 0.083% IN NEBU
2.5000 mg | INHALATION_SOLUTION | Freq: Once | RESPIRATORY_TRACT | Status: AC
  Administered 2015-06-22: 2.5 mg via RESPIRATORY_TRACT
  Filled 2015-06-22: qty 3

## 2015-06-22 MED ORDER — ALBUTEROL SULFATE HFA 108 (90 BASE) MCG/ACT IN AERS: 1.0000 | INHALATION_SPRAY | Freq: Four times a day (QID) | RESPIRATORY_TRACT | Status: AC | PRN

## 2015-06-22 MED ORDER — PREDNISONE 50 MG PO TABS
60.0000 mg | ORAL_TABLET | Freq: Once | ORAL | Status: AC
  Administered 2015-06-22: 60 mg via ORAL
  Filled 2015-06-22: qty 1

## 2015-06-22 NOTE — ED Provider Notes (Signed)
CSN: [id number]     Arrival date & time 06/22/15  1517 History   First MD Initiated Contact with Patient 06/22/15 1528     Chief Complaint  Patient presents with  . Shortness of Breath      HPI  Disposition evaluation difficulty breathing. States he awakened this morning got out of bed felt short of breath. States he had his typical symptoms which she describes as "tightness all through my chest". No diaphoresis no nausea. Worse with activity. Has not had any recent exertional symptoms. States this feels typical for his asthma. He is on albuterol inhaler as well as" another daily inhaler". He lists Qvar. States he is compliant with both.  Past Medical History  Diagnosis Date  . Seasonal allergies   . Asthma    Past Surgical History  Procedure Laterality Date  . Ankle surgery Right    No family history on file. Social History  Substance Use Topics  . Smoking status: Never Smoker   . Smokeless tobacco: None  . Alcohol Use: No    Review of Systems  Constitutional: Negative for fever, chills, diaphoresis, appetite change and fatigue.  HENT: Negative for mouth sores, sore throat and trouble swallowing.   Eyes: Negative for visual disturbance.  Respiratory: Positive for chest tightness and shortness of breath. Negative for cough and wheezing.   Cardiovascular: Negative for chest pain.  Gastrointestinal: Negative for nausea, vomiting, abdominal pain, diarrhea and abdominal distention.  Endocrine: Negative for polydipsia, polyphagia and polyuria.  Genitourinary: Negative for dysuria, frequency and hematuria.  Musculoskeletal: Negative for gait problem.  Skin: Negative for color change, pallor and rash.  Neurological: Negative for dizziness, syncope, light-headedness and headaches.  Hematological: Does not bruise/bleed easily.  Psychiatric/Behavioral: Negative for behavioral problems and confusion.      Allergies  Tomato; Tramadol; and Clotrimazole  Home Medications    Prior to Admission medications   Medication Sig Start Date End Date Taking? Authorizing Provider  albuterol (PROVENTIL HFA;VENTOLIN HFA) 108 (90 Base) MCG/ACT inhaler Inhale 1-2 puffs into the lungs every 6 (six) hours as needed for wheezing. 06/22/15   Rolland PorterMark Caroly Purewal, MD  beclomethasone (QVAR) 80 MCG/ACT inhaler Inhale 1 puff into the lungs 2 (two) times daily. 02/16/14   Kermit Baloavid S Tysinger, PA-C  erythromycin ophthalmic ointment Place a 1/2 inch ribbon of ointment into the lower eyelid. 06/15/14   Tatyana Kirichenko, PA-C  HYDROcodone-acetaminophen (NORCO) 5-325 MG per tablet Take 1 tablet by mouth every 6 (six) hours as needed. 06/15/14   Tatyana Kirichenko, PA-C  hydrOXYzine (ATARAX/VISTARIL) 25 MG tablet Take 1 tablet (25 mg total) by mouth every 6 (six) hours as needed for itching. 09/14/14   Jerelyn ScottMartha Linker, MD  loratadine (CLARITIN) 10 MG tablet Take 1 tablet (10 mg total) by mouth daily. 08/08/11 02/16/14  Dayton BailiffAndrew King, MD  Multiple Vitamin (MULTIVITAMIN WITH MINERALS) TABS tablet Take 1 tablet by mouth daily.    Historical Provider, MD  Naphazoline HCl (CLEAR EYES OP) Place 2 drops into both eyes 2 (two) times daily.    Historical Provider, MD  naproxen (NAPROSYN) 500 MG tablet Take 1 tablet (500 mg total) by mouth 2 (two) times daily with a meal. 02/16/14   Kermit Baloavid S Tysinger, PA-C  Olopatadine HCl (PATADAY) 0.2 % SOLN Apply 1 drop to eye daily. 06/15/14   Tatyana Kirichenko, PA-C  predniSONE (DELTASONE) 20 MG tablet Take 1 tablet (20 mg total) by mouth 2 (two) times daily with a meal. 06/22/15   Rolland PorterMark Nalina Yeatman, MD  BP 124/78 mmHg  Pulse 82  Temp(Src) 98.7 F (37.1 C) (Oral)  Resp   Ht  (1.803 m)  Wt 240 lb (108.863 kg)  BMI 33.49 kg/m2  SpO2 100% Physical Exam  Constitutional: He is oriented to person, place, and time. He appears well-developed and well-nourished. No distress.  HENT:  Head: Normocephalic.  Eyes: Conjunctivae are normal. Pupils are equal, round, and reactive to light. No scleral  icterus.  Neck: Normal range of motion. Neck supple. No thyromegaly present.  Cardiovascular: Normal rate and regular rhythm.  Exam reveals no gallop and no friction rub.   No murmur heard. Pulmonary/Chest: Effort normal and breath sounds normal. No respiratory distress. He has no wheezes. He has no rales.  Only diminished breath sounds. Mild prolongation. No focal signs of consolidation or diminished breath sounds  Abdominal: Soft. Bowel sounds are normal. He exhibits no distension. There is no tenderness. There is no rebound.  Musculoskeletal: Normal range of motion.  Neurological: He is alert and oriented to person, place, and time.  Skin: Skin is warm and dry. No rash noted.  Psychiatric: He has a normal mood and affect. His behavior is normal.    ED Course  Procedures (including critical care time) Labs Review Labs Reviewed  CBC WITH DIFFERENTIAL/PLATELET  BASIC METABOLIC PANEL  TROPONIN I    Imaging Review Dg Chest 2 View  06/22/2015  CLINICAL DATA:  Shortness of breath.  Asthma.  Left chest pain. EXAM: CHEST  2 VIEW COMPARISON:  08/08/2011 FINDINGS: The heart size and mediastinal contours are within normal limits. Both lungs are clear. The visualized skeletal structures are unremarkable. IMPRESSION: No active cardiopulmonary disease. Electronically Signed   By: Myles Rosenthal M.D.   On: 06/22/2015 16:21   I have personally reviewed and evaluated these images and lab results as part of my medical decision-making.   EKG Interpretation   Date/Time:  Tuesday June 22 2015 15:42:03 EDT Ventricular Rate:  80 PR Interval:  200 QRS Duration: 88 QT Interval:  359 QTC Calculation: 414 R Axis:   -5 Text Interpretation:  Sinus rhythm Inferior infarct, old Confirmed by  Fayrene Fearing  MD, Dionta Larke ([id number]) on 06/22/2015 3:46:28 PM      MDM   Final diagnoses:  Asthma exacerbation  Allergic rhinitis, unspecified allergic rhinitis type    EKG shows old inferior changes. No acute  abnormalities. Normal troponin. Normal chest x-ray. He states he feels "much much better. On exam he has their lungs. Better aeration. 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 A bit without desaturation or symptoms. Plan is home, prednisone, continue Claritin or Zyrtec. When necessary albuterol. Continue maintenance medications.    Rolland Porter, MD 06/22/15 [phone]

## 2015-06-22 NOTE — ED Notes (Signed)
Sob x 1 week. Hx asthma. States allergy season always triggers his asthma.

## 2015-06-22 NOTE — Discharge Instructions (Signed)
Allergic Rhinitis Allergic rhinitis is when the mucous membranes in the nose respond to allergens. Allergens are particles in the air that cause your body to have an allergic reaction. This causes you to release allergic antibodies. Through a chain of events, these eventually cause you to release histamine into the blood stream. Although meant to protect the body, it is this release of histamine that causes your discomfort, such as frequent sneezing, congestion, and an itchy, runny nose.  CAUSES Seasonal allergic rhinitis (hay fever) is caused by pollen allergens that may come from grasses, trees, and weeds. Year-round allergic rhinitis (perennial allergic rhinitis) is caused by allergens such as house dust mites, pet dander, and mold spores. SYMPTOMS  Nasal stuffiness (congestion).  Itchy, runny nose with sneezing and tearing of the eyes. DIAGNOSIS Your health care provider can help you determine the allergen or allergens that trigger your symptoms. If you and your health care provider are unable to determine the allergen, skin or blood testing may be used. Your health care provider will diagnose your condition after taking your health history and performing a physical exam. Your health care provider may assess you for other related conditions, such as asthma, pink eye, or an ear infection. TREATMENT Allergic rhinitis does not have a cure, but it can be controlled by:  Medicines that block allergy symptoms. These may include allergy shots, nasal sprays, and oral antihistamines.  Avoiding the allergen. Hay fever may often be treated with antihistamines in pill or nasal spray forms. Antihistamines block the effects of histamine. There are over-the-counter medicines that may help with nasal congestion and swelling around the eyes. Check with your health care provider before taking or giving this medicine. If avoiding the allergen or the medicine prescribed do not work, there are many new medicines  your health care provider can prescribe. Stronger medicine may be used if initial measures are ineffective. Desensitizing injections can be used if medicine and avoidance does not work. Desensitization is when a patient is given ongoing shots until the body becomes less sensitive to the allergen. Make sure you follow up with your health care provider if problems continue. HOME CARE INSTRUCTIONS It is not possible to completely avoid allergens, but you can reduce your symptoms by taking steps to limit your exposure to them. It helps to know exactly what you are allergic to so that you can avoid your specific triggers. SEEK MEDICAL CARE IF:  You have a fever.  You develop a cough that does not stop easily (persistent).  You have shortness of breath.  You start wheezing.  Symptoms interfere with normal daily activities.   This information is not intended to replace advice given to you by your health care provider. Make sure you discuss any questions you have with your health care provider.   Document Released: 12/20/2000 Document Revised: 04/17/2014 Document Reviewed: 12/02/2012 Elsevier Interactive Patient Education 2016 Elsevier Inc.  Bronchospasm, Adult A bronchospasm is when the tubes that carry air in and out of your lungs (airways) spasm or tighten. During a bronchospasm it is hard to breathe. This is because the airways get smaller. A bronchospasm can be triggered by:  Allergies. These may be to animals, pollen, food, or mold.  Infection. This is a common cause of bronchospasm.  Exercise.  Irritants. These include pollution, cigarette smoke, strong odors, aerosol sprays, and paint fumes.  Weather changes.  Stress.  Being emotional. HOME CARE   Always have a plan for getting help. Know when to call your  doctor and local emergency services (911 in the [place] Know where you can get emergency care.  Only take medicines as told by your doctor.  If you were prescribed an  inhaler or nebulizer machine, ask your doctor how to use it correctly. Always use a spacer with your inhaler if you were given one.  Stay calm during an attack. Try to relax and breathe more slowly.  Control your home environment:  Change your heating and air conditioning filter at least once a month.  Limit your use of fireplaces and wood stoves.  Do not  smoke. Do not  allow smoking in your home.  Avoid perfumes and fragrances.  Get rid of pests (such as roaches and mice) and their droppings.  Throw away plants if you see mold on them.  Keep your house clean and dust free.  Replace carpet with wood, tile, or vinyl flooring. Carpet can trap dander and dust.  Use allergy-proof pillows, mattress covers, and box spring covers.  Wash bed sheets and blankets every week in hot water. Dry them in a dryer.  Use blankets that are made of polyester or cotton.  Wash hands frequently. GET HELP IF:  You have muscle aches.  You have chest pain.  The thick spit you spit or cough up (sputum) changes from clear or white to yellow, green, gray, or bloody.  The thick spit you spit or cough up gets thicker.  There are problems that may be related to the medicine you are given such as:  A rash.  Itching.  Swelling.  Trouble breathing. GET HELP RIGHT AWAY IF:  You feel you cannot breathe or catch your breath.  You cannot stop coughing.  Your treatment is not helping you breathe better.  You have very bad chest pain. MAKE SURE YOU:   Understand these instructions.  Will watch your condition.  Will get help right away if you are not doing well or get worse.   This information is not intended to replace advice given to you by your health care provider. Make sure you discuss any questions you have with your health care provider.   Document Released: 01/22/2009 Document Revised: 04/17/2014 Document Reviewed: 09/17/2012 Elsevier Interactive Patient Education 2016 Elsevier  Inc.  Asthma, Adult Asthma is a recurring condition in which the airways tighten and narrow. Asthma can make it difficult to breathe. It can cause coughing, wheezing, and shortness of breath. Asthma episodes, also called asthma attacks, range from minor to life-threatening. Asthma cannot be cured, but medicines and lifestyle changes can help control it. CAUSES Asthma is believed to be caused by inherited (genetic) and environmental factors, but its exact cause is unknown. Asthma may be triggered by allergens, lung infections, or irritants in the air. Asthma triggers are different for each person. Common triggers include:   Animal dander.  Dust mites.  Cockroaches.  Pollen from trees or grass.  Mold.  Smoke.  Air pollutants such as dust, household cleaners, hair sprays, aerosol sprays, paint fumes, strong chemicals, or strong odors.  Cold air, weather changes, and winds (which increase molds and pollens in the air).  Strong emotional expressions such as crying or laughing hard.  Stress.  Certain medicines (such as aspirin) or types of drugs (such as beta-blockers).  Sulfites in foods and drinks. Foods and drinks that may contain sulfites include dried fruit, potato chips, and sparkling grape juice.  Infections or inflammatory conditions such as the flu, a cold, or an inflammation of the nasal membranes (  rhinitis).  Gastroesophageal reflux disease (GERD).  Exercise or strenuous activity. SYMPTOMS Symptoms may occur immediately after asthma is triggered or many hours later. Symptoms include:  Wheezing.  Excessive nighttime or early morning coughing.  Frequent or severe coughing with a common cold.  Chest tightness.  Shortness of breath. DIAGNOSIS  The diagnosis of asthma is made by a review of your medical history and a physical exam. Tests may also be performed. These may include:  Lung function studies. These tests show how much air you breathe in and out.  Allergy  tests.  Imaging tests such as X-rays. TREATMENT  Asthma cannot be cured, but it can usually be controlled. Treatment involves identifying and avoiding your asthma triggers. It also involves medicines. There are 2 classes of medicine used for asthma treatment:   Controller medicines. These prevent asthma symptoms from occurring. They are usually taken every day.  Reliever or rescue medicines. These quickly relieve asthma symptoms. They are used as needed and provide short-term relief. Your health care provider will help you create an asthma action plan. An asthma action plan is a written plan for managing and treating your asthma attacks. It includes a list of your asthma triggers and how they may be avoided. It also includes information on when medicines should be taken and when their dosage should be changed. An action plan may also involve the use of a device called a peak flow meter. A peak flow meter measures how well the lungs are working. It helps you monitor your condition. HOME CARE INSTRUCTIONS   Take medicines only as directed by your health care provider. Speak with your health care provider if you have questions about how or when to take the medicines.  Use a peak flow meter as directed by your health care provider. Record and keep track of readings.  Understand and use the action plan to help minimize or stop an asthma attack without needing to seek medical care.  Control your home environment in the following ways to help prevent asthma attacks:  Do not smoke. Avoid being exposed to secondhand smoke.  Change your heating and air conditioning filter regularly.  Limit your use of fireplaces and wood stoves.  Get rid of pests (such as roaches and mice) and their droppings.  Throw away plants if you see mold on them.  Clean your floors and dust regularly. Use unscented cleaning products.  Try to have someone else vacuum for you regularly. Stay out of rooms while they are  being vacuumed and for a short while afterward. If you vacuum, use a dust mask from a hardware store, a double-layered or microfilter vacuum cleaner bag, or a vacuum cleaner with a HEPA filter.  Replace carpet with wood, tile, or vinyl flooring. Carpet can trap dander and dust.  Use allergy-proof pillows, mattress covers, and box spring covers.  Wash bed sheets and blankets every week in hot water and dry them in a dryer.  Use blankets that are made of polyester or cotton.  Clean bathrooms and kitchens with bleach. If possible, have someone repaint the walls in these rooms with mold-resistant paint. Keep out of the rooms that are being cleaned and painted.  Wash hands frequently. SEEK MEDICAL CARE IF:   You have wheezing, shortness of breath, or a cough even if taking medicine to prevent attacks.  The colored mucus you cough up (sputum) is thicker than usual.  Your sputum changes from clear or white to yellow, green, gray, or bloody.  You have any problems that may be related to the medicines you are taking (such as a rash, itching, swelling, or trouble breathing).  You are using a reliever medicine more than 2-3 times per week.  Your peak flow is still at 50-79% of your personal best after following your action plan for 1 hour.  You have a fever. SEEK IMMEDIATE MEDICAL CARE IF:   You seem to be getting worse and are unresponsive to treatment during an asthma attack.  You are short of breath even at rest.  You get short of breath when doing very little physical activity.  You have difficulty eating, drinking, or talking due to asthma symptoms.  You develop chest pain.  You develop a fast heartbeat.  You have a bluish color to your lips or fingernails.  You are light-headed, dizzy, or faint.  Your peak flow is less than 50% of your personal best.   This information is not intended to replace advice given to you by your health care provider. Make sure you discuss any  questions you have with your health care provider.   Document Released: 03/27/2005 Document Revised: 12/16/2014 Document Reviewed: 10/24/2012 Elsevier Interactive Patient Education [employer]

## 2015-06-22 NOTE — ED Notes (Signed)
Brett CanalesSteve, RRT at bedside. Pt receiving treatment.

## 2015-07-29 ENCOUNTER — Ambulatory Visit (INDEPENDENT_AMBULATORY_CARE_PROVIDER_SITE_OTHER): Payer: Managed Care, Other (non HMO) | Admitting: Family Medicine

## 2015-07-29 ENCOUNTER — Encounter: Payer: Self-pay | Admitting: Family Medicine

## 2015-07-29 VITALS — BP 118/78 | HR 92 | Temp 98.4°F | Resp 16 | Wt 236.2 lb

## 2015-07-29 DIAGNOSIS — R059 Cough, unspecified: Secondary | ICD-10-CM

## 2015-07-29 DIAGNOSIS — R05 Cough: Secondary | ICD-10-CM | POA: Diagnosis not present

## 2015-07-29 DIAGNOSIS — R918 Other nonspecific abnormal finding of lung field: Secondary | ICD-10-CM

## 2015-07-29 DIAGNOSIS — J4540 Moderate persistent asthma, uncomplicated: Secondary | ICD-10-CM | POA: Diagnosis not present

## 2015-07-29 DIAGNOSIS — J302 Other seasonal allergic rhinitis: Secondary | ICD-10-CM

## 2015-07-29 MED ORDER — MONTELUKAST SODIUM 10 MG PO TABS: 10.0000 mg | ORAL_TABLET | Freq: Every day | ORAL | Status: AC

## 2015-07-29 MED ORDER — FLUTICASONE FUROATE-VILANTEROL 200-25 MCG/INH IN AEPB: 1.0000 | INHALATION_SPRAY | Freq: Every day | RESPIRATORY_TRACT | Status: DC

## 2015-07-29 NOTE — Patient Instructions (Signed)
I am referring you to a Pulmonologist for a formal evaluation and recommendation. They will call you to schedule an appointment. Start Breo 1 puff daily. Rinse your mouth after using. You should not be needing your albuterol (rescue) inhaler as often as you have been, this means your asthma is not well controlled.  Also, your allergies are contributing to your symptoms and I recommend continuing daily Claritin. I am adding singulair for allergies. Also try using Rhinocort or Flonase for allergy control.

## 2015-07-29 NOTE — Progress Notes (Signed)
Subjective:    Patient ID: Curtis Savage, male    DOB: 12/17/1960, 55 y.o.   MRN: [id number]  HPI Chief Complaint  Patient presents with  . allergies    allergies/asthmas, running nose, itchy throat, drainage, cough, chest congestion, wheezing   He is here with complaints of 3 week history of cough productive of yellow-ish white sputum. Sneezing, watery eyes, throat itching, runny nose. States he has history of seasonal allergies and states he is having a hard time with these right now. He is taking Claritin D.  States he went to ED approximately 3 weeks ago and they gave him prednisone, antibiotic and did a chest XR that was normal. States he was able to breath better but cough has not improved.  Has taken Qvar in past but has not had this medication for "months". Has been using albuterol inhaler "3 times a day for past 2 weeks or longer".   Asthma - diagnosed as a child. States his triggers are dust, pollen, strong chemicals. Has never seen a pulmonologist.  Denies history of smoking. 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  Denies fever, chills, dizziness, chest pain, palpitations, nausea, vomiting, diarrhea.    Past Medical History  Diagnosis Date  . Seasonal allergies   . Asthma     Reviewed allergies, medications, past medical and social history.   Review of Systems Pertinent positives and negatives in the history of present illness.     Objective:   Physical Exam BP 118/78 mmHg  Pulse 92  Temp(Src) 98.4 F (36.9 C) (Oral)  Resp 16  Wt 236 lb 3.2 oz (107.14 kg)  SpO2 99% Alert and in no distress. No sinus tenderness. Nares with edema, erythema and clear drainage. Tympanic membranes and canals are normal. Pharyngeal area is erythematous. Neck is supple without adenopathy or thyromegaly. Cardiac exam shows a regular sinus rhythm without murmurs or gallops. Lungs are clear to auscultation, no  wheezing, rales or rhonchi. Extremity exam shows no edema, pulses intact.  PFT shows moderate restriction.      Assessment & Plan:  Cough - Plan: Spirometry with graph  Seasonal allergies - Plan: montelukast (SINGULAIR) 10 MG tablet, Ambulatory referral to Pulmonology  Asthma, moderate persistent, uncomplicated - Plan: Spirometry with graph, fluticasone furoate-vilanterol (BREO ELLIPTA) 200-25 MCG/INH AEPB, Ambulatory referral to Pulmonology  Multiple lung nodules on CT - Plan: Ambulatory referral to Pulmonology  Reviewed chest x-ray from the emergency department and this was normal, cough has not changed since his visit.  Discussed that his asthma is poorly controlled and I recommend that he start Breo, 1 puff daily, sample given. Discussed that he should not be needing his albuterol inhaler as often as he has been. Reviewed CT chest from 2013 that showed multiple lung nodules and he has not followed up on this. Referral made to pulmonology for further evaluation and recommendations. Also discussed that his allergies are poorly controlled and I recommend he continue taking daily Claritin and add Flonase and Singulair. Singulair prescription sent to pharmacy. Discussed that it would not be appropriate to fill out FMLA today based on the fact that he has not been properly treating his allergies or asthma and that once we get these better controlled he should be able to tolerate his work environment better. Recommend that he schedule an appointment with Vincenza HewsShane, PCP, for complete physical exam and blood work, he has not seen him  since 2015.

## 2015-08-02 ENCOUNTER — Encounter: Payer: Self-pay | Admitting: Internal Medicine

## 2015-08-10 ENCOUNTER — Encounter (HOSPITAL_BASED_OUTPATIENT_CLINIC_OR_DEPARTMENT_OTHER): Payer: Self-pay | Admitting: *Deleted

## 2015-08-10 ENCOUNTER — Emergency Department (HOSPITAL_BASED_OUTPATIENT_CLINIC_OR_DEPARTMENT_OTHER)
Admission: EM | Admit: 2015-08-10 | Discharge: 2015-08-10 | Disposition: A | Discharge status: Home / Self Care | Payer: Managed Care, Other (non HMO) | Attending: Emergency Medicine | Admitting: Emergency Medicine

## 2015-08-10 DIAGNOSIS — J45909 Unspecified asthma, uncomplicated: Secondary | ICD-10-CM | POA: Insufficient documentation

## 2015-08-10 DIAGNOSIS — L03317 Cellulitis of buttock: Secondary | ICD-10-CM | POA: Insufficient documentation

## 2015-08-10 DIAGNOSIS — L0291 Cutaneous abscess, unspecified: Secondary | ICD-10-CM

## 2015-08-10 MED ORDER — LIDOCAINE-EPINEPHRINE (PF) 2 %-1:200000 IJ SOLN
10.0000 mL | Freq: Once | INTRAMUSCULAR | Status: AC
  Administered 2015-08-10: 10 mL
  Filled 2015-08-10: qty 20

## 2015-08-10 NOTE — Discharge Instructions (Signed)
Abscess °An abscess is an infected area that contains a collection of pus and debris. It can occur in almost any part of the body. An abscess is also known as a furuncle or boil. °CAUSES  °An abscess occurs when tissue gets infected. This can occur from blockage of oil or sweat glands, infection of hair follicles, or a minor injury to the skin. As the body tries to fight the infection, pus collects in the area and creates pressure under the skin. This pressure causes pain. People with weakened immune systems have difficulty fighting infections and get certain abscesses more often.  °SYMPTOMS °Usually an abscess develops on the skin and becomes a painful mass that is red, warm, and tender. If the abscess forms under the skin, you may feel a moveable soft area under the skin. Some abscesses break open (rupture) on their own, but most will continue to get worse without care. The infection can spread deeper into the body and eventually into the bloodstream, causing you to feel ill.  °DIAGNOSIS  °Your caregiver will take your medical history and perform a physical exam. A sample of fluid may also be taken from the abscess to determine what is causing your infection. °TREATMENT  °Your caregiver may prescribe antibiotic medicines to fight the infection. However, taking antibiotics alone usually does not cure an abscess. Your caregiver may need to make a small cut (incision) in the abscess to drain the pus. In some cases, gauze is packed into the abscess to reduce pain and to continue draining the area. °HOME CARE INSTRUCTIONS  °· Only take over-the-counter or prescription medicines for pain, discomfort, or fever as directed by your caregiver. °· If you were prescribed antibiotics, take them as directed. Finish them even if you start to feel better. °· If gauze is used, follow your caregiver's directions for changing the gauze. °· To avoid spreading the infection: °· Keep your draining abscess covered with a  bandage. °· Wash your hands well. °· Do not share personal care items, towels, or whirlpools with others. °· Avoid skin contact with others. °· Keep your skin and clothes clean around the abscess. °· Keep all follow-up appointments as directed by your caregiver. °SEEK MEDICAL CARE IF:  °· You have increased pain, swelling, redness, fluid drainage, or bleeding. °· You have muscle aches, chills, or a general ill feeling. °· You have a fever. °MAKE SURE YOU:  °· Understand these instructions. °· Will watch your condition. °· Will get help right away if you are not doing well or get worse. °  °This information is not intended to replace advice given to you by your health care provider. Make sure you discuss any questions you have with your health care provider. °  °Document Released: 01/04/2005 Document Revised: 09/26/2011 Document Reviewed: 06/09/2011 °Elsevier Interactive Patient Education ©2016 [employer] ° °Incision and Drainage °Incision and drainage is a procedure in which a sac-like structure (cystic structure) is opened and drained. The area to be drained usually contains material such as pus, fluid, or blood.  °LET YOUR CAREGIVER KNOW ABOUT:  °· Allergies to medicine. °· Medicines taken, including vitamins, herbs, eyedrops, over-the-counter medicines, and creams. °· Use of steroids (by mouth or creams). °· Previous problems with anesthetics or numbing medicines. °· History of bleeding problems or blood clots. °· Previous surgery. °· Other health problems, including diabetes and kidney problems. °· Possibility of pregnancy, if this applies. °RISKS AND COMPLICATIONS °· Pain. °· Bleeding. °· Scarring. °· Infection. °BEFORE THE PROCEDURE  °  You may need to have an ultrasound or other imaging tests to see how large or deep your cystic structure is. Blood tests may also be used to determine if you have an infection or how severe the infection is. You may need to have a tetanus shot. °PROCEDURE  °The affected area  is cleaned with a cleaning fluid. The cyst area will then be numbed with a medicine (local anesthetic). A small incision will be made in the cystic structure. A syringe or catheter may be used to drain the contents of the cystic structure, or the contents may be squeezed out. The area will then be flushed with a cleansing solution. After cleansing the area, it is often gently packed with a gauze or another wound dressing. Once it is packed, it will be covered with gauze and tape or some other type of wound dressing.  °AFTER THE PROCEDURE  °· Often, you will be allowed to go home right after the procedure. °· You may be given antibiotic medicine to prevent or heal an infection. °· If the area was packed with gauze or some other wound dressing, you will likely need to come back in 1 to 2 days to get it removed. °· The area should heal in about 14 days. °  °This information is not intended to replace advice given to you by your health care provider. Make sure you discuss any questions you have with your health care provider. °  °Document Released: 09/20/2000 Document Revised: 09/26/2011 Document Reviewed: 05/22/2011 °Elsevier Interactive Patient Education ©2016 Elsevier Inc[employer] ° °

## 2015-08-10 NOTE — ED Notes (Signed)
Pt c/o abscess to R buttock, states it popped on its own and now feels there is another one coming up beside it. Pt has been cleaning the area with peroxide.

## 2015-08-10 NOTE — ED Provider Notes (Signed)
CSN: [id number]     Arrival date & time 08/10/15  1310 History   First MD Initiated Contact with Patient 08/10/15 1441     Chief Complaint  Patient presents with  . Abscess    Curtis Savage is a 55 y.o. male who presents to the emergency department complaining of an abscess to his right buttocks for the past 1-2 weeks. He reports approximately 1 week ago he had some drainage from one site. He reports he now has 2 more sites of abscess that are causing him 8/10 pain. No further discharge. No fevers. He reports he has had abscesses previously but not in this location. No pain around his rectum. No abdominal pain, nausea or vomiting. No rashes on his body. No fevers. He has taken nothing for treatment of his symptoms today.  Patient is a 55 y.o. male presenting with abscess. The history is provided by the patient. No language interpreter was used.  Abscess Associated symptoms: no fever, no nausea and no vomiting     Past Medical History  Diagnosis Date  . Seasonal allergies   . Asthma    Past Surgical History  Procedure Laterality Date  . Ankle surgery Right    History reviewed. No pertinent family history. Social History  Substance Use Topics  . Smoking status: Never Smoker   . Smokeless tobacco: None  . Alcohol Use: No    Review of Systems  Constitutional: Negative for fever.  Gastrointestinal: Negative for nausea, vomiting, abdominal pain and rectal pain.  Genitourinary: Negative for dysuria and difficulty urinating.  Skin: Positive for wound.  Neurological: Negative for weakness and numbness.      Allergies  Tomato; Tramadol; and Clotrimazole  Home Medications   Prior to Admission medications   Medication Sig Start Date End Date Taking? Authorizing Provider  albuterol (PROVENTIL HFA;VENTOLIN HFA) 108 (90 Base) MCG/ACT inhaler Inhale 1-2 puffs into the lungs every 6 (six) hours as needed for wheezing. 06/22/15   Rolland Porter, MD  fluticasone furoate-vilanterol (BREO  ELLIPTA) 200-25 MCG/INH AEPB Inhale 1 puff into the lungs daily. 07/29/15   Avanell Shackleton, NP  loratadine (CLARITIN) 10 MG tablet Take 1 tablet (10 mg total) by mouth daily. 08/08/11 02/16/14  Dayton Bailiff, MD  montelukast (SINGULAIR) 10 MG tablet Take 1 tablet (10 mg total) by mouth at bedtime. 07/29/15   Avanell Shackleton, NP  Multiple Vitamin (MULTIVITAMIN WITH MINERALS) TABS tablet Take 1 tablet by mouth daily.    Historical Provider, MD  Naphazoline HCl (CLEAR EYES OP) Place 2 drops into both eyes 2 (two) times daily.    Historical Provider, MD  naproxen (NAPROSYN) 500 MG tablet Take 1 tablet (500 mg total) by mouth 2 (two) times daily with a meal. 02/16/14   Kermit Balo Tysinger, PA-C   BP 123/84 mmHg  Pulse 95  Temp(Src) 98.6 F (37 C) (Oral)  Resp 16  Ht  (1.803 m)  Wt 107.956 kg  BMI 33.21 kg/m2  SpO2 98% Physical Exam  Constitutional: He appears well-developed and well-nourished. No distress.  Nontoxic appearing.  HENT:  Head: Normocephalic and atraumatic.  Eyes: Right eye exhibits no discharge. Left eye exhibits no discharge.  Cardiovascular: Normal rate, regular rhythm and intact distal pulses.   Pulmonary/Chest: Effort normal and breath sounds normal. No respiratory distress.  Abdominal: Soft. There is no tenderness.  Genitourinary:  Patient has 2 fluctuant and tender abscess to his right lateral buttock cheek. No discharge noted. No surrounding erythema. They are more  than 10 cm lateral from the rectum and are not perianal or perirectal.   Neurological: He is alert. Coordination normal.  Skin: Skin is warm and dry. No rash noted. He is not diaphoretic. No erythema. No pallor.  See GU.   Psychiatric: He has a normal mood and affect. His behavior is normal.    ED Course  .Marland KitchenIncision and Drainage Date/Time: 08/10/2015 3:35 PM Performed by: Everlene Farrier Authorized by: Everlene Farrier Consent: Verbal consent obtained. Risks and benefits: risks, benefits and alternatives  were discussed Consent given by: patient Patient understanding: patient states understanding of the procedure being performed Patient consent: the patient's understanding of the procedure matches consent given Procedure consent: procedure consent matches procedure scheduled Relevant documents: relevant documents present and verified Test results: test results available and properly labeled Site marked: the operative site was marked Imaging studies: imaging studies available Required items: required blood products, implants, devices, and special equipment available Patient identity confirmed: verbally with patient Time out: Immediately prior to procedure a "time out" was called to verify the correct patient, procedure, equipment, support staff and site/side marked as required. Type: abscess Body area: anogenital (right buttocks ) Anesthesia: local infiltration Local anesthetic: lidocaine 2% with epinephrine Anesthetic total: 2 ml Patient sedated: no Scalpel size: 11 Needle gauge: 22 Incision type: single straight Incision depth: dermal Complexity: simple Drainage: purulent Drainage amount: moderate Wound treatment: wound left open Packing material: none Patient tolerance: Patient tolerated the procedure well with no immediate complications Comments: Two abscesses drained and tolerated well by the patient.    (including critical care time) Labs Review Labs Reviewed - No data to display  Imaging Review No results found.  EMERGENCY DEPARTMENT US SOFT TISSUE INTERPRETATION "Study: Limited Ultrasound of the noted body part in comments below"  INDICATIONS: Soft tissue infection Multiple views of the body part are obtained with a multi-frequency linear probe  PERFORMED BY:  Myself  IMAGES ARCHIVED?: Yes  SIDE:Right   BODY PART:Other soft tisse (comment in note)  FINDINGS: Abcess present  LIMITATIONS:  Body Habitus  INTERPRETATION:  Abcess present  COMMENT:  Two  abscesses noted to right buttocks      EKG Interpretation None      Filed Vitals:   08/10/15 1318  BP: 123/84  Pulse: 95  Temp: 98.6 F (37 C)  TempSrc: Oral  Resp: 16  Height: 5\' 11"  (1.803 m)  Weight: 107.956 kg  SpO2: 98%     MDM   Meds given in ED:  Medications  lidocaine-EPINEPHrine (XYLOCAINE W/EPI) 2 %-1:200000 (PF) injection 10 mL (10 mLs Infiltration Given 08/10/15 1513)    New Prescriptions   No medications on file    Final diagnoses:  Abscess   This is a 55 y.o. male who presents to the emergency department complaining of an abscess to his right buttocks for the past 1-2 weeks. He reports approximately 1 week ago he had some drainage from one site. He reports he now has 2 more sites of abscess that are causing him 8/10 pain. No further discharge. No fevers. He reports he has had abscesses previously but not in this location. No pain around his rectum. On exam the patient is afebrile nontoxic appearing. He has 2 fluctuant and tender abscess is noted to the lateral aspect of his right butt cheek. They're more than 10 cm away from his rectum. No discharge noted. Ultrasound was used to further visualize the abscess. There are 2 abscesses noted to the lateral acids to his right  buttocks. No drainage noted. No evidence of cellulitis.  Incision and drainage was performed by me and tolerated well by the patient. A moderate amount of purulent discharge obtained from both sites. There were dressed and cleaned by me. I discussed post incision and drainage wound instructions and precautions. I encouraged him to follow-up with primary care in about 7 days to have his wounds rechecked. I advised the patient to follow-up with their primary care provider this week. I advised the patient to return to the emergency department with new or worsening symptoms or new concerns. The patient verbalized understanding and agreement with plan.       Everlene FarrierWilliam Eitan Doubleday, PA-C 08/10/15 1607  Doug SouSam  Jacubowitz, MD 08/10/15 2350

## 2015-08-10 NOTE — ED Notes (Signed)
Pt c/o abscess x 1 on right buttocks  X 2 weeks

## 2015-08-11 ENCOUNTER — Emergency Department (HOSPITAL_COMMUNITY)
Admission: EM | Admit: 2015-08-11 | Discharge: 2015-08-11 | Disposition: A | Discharge status: Home / Self Care | Payer: Managed Care, Other (non HMO) | Attending: Emergency Medicine | Admitting: Emergency Medicine

## 2015-08-11 ENCOUNTER — Encounter (HOSPITAL_COMMUNITY): Payer: Self-pay | Admitting: Family Medicine

## 2015-08-11 DIAGNOSIS — Z7951 Long term (current) use of inhaled steroids: Secondary | ICD-10-CM | POA: Insufficient documentation

## 2015-08-11 DIAGNOSIS — J45901 Unspecified asthma with (acute) exacerbation: Secondary | ICD-10-CM

## 2015-08-11 DIAGNOSIS — Z79899 Other long term (current) drug therapy: Secondary | ICD-10-CM | POA: Insufficient documentation

## 2015-08-11 DIAGNOSIS — R062 Wheezing: Secondary | ICD-10-CM | POA: Diagnosis present

## 2015-08-11 MED ORDER — ALBUTEROL SULFATE (2.5 MG/3ML) 0.083% IN NEBU
5.0000 mg | INHALATION_SOLUTION | Freq: Once | RESPIRATORY_TRACT | Status: AC
  Administered 2015-08-11: 5 mg via RESPIRATORY_TRACT
  Filled 2015-08-11: qty 6

## 2015-08-11 MED ORDER — PREDNISONE 20 MG PO TABS
60.0000 mg | ORAL_TABLET | Freq: Once | ORAL | Status: AC
  Administered 2015-08-11: 60 mg via ORAL
  Filled 2015-08-11: qty 3

## 2015-08-11 MED ORDER — IPRATROPIUM BROMIDE 0.02 % IN SOLN
0.5000 mg | Freq: Once | RESPIRATORY_TRACT | Status: AC
  Administered 2015-08-11: 0.5 mg via RESPIRATORY_TRACT
  Filled 2015-08-11: qty 2.5

## 2015-08-11 MED ORDER — PREDNISONE 20 MG PO TABS: 40.0000 mg | ORAL_TABLET | Freq: Every day | ORAL | Status: DC

## 2015-08-11 NOTE — Discharge Instructions (Signed)
Please read and follow all provided instructions.  Your diagnoses today include:  1. Asthma exacerbation    Tests performed today include:  Vital signs. See below for your results today.   Medications prescribed:   Prednisone - steroid medicine   It is best to take this medication in the morning to prevent sleeping problems. If you are diabetic, monitor your blood sugar closely and stop taking Prednisone if blood sugar is over 300. Take with food to prevent stomach upset.   Take any prescribed medications only as directed.  Home care instructions:  Follow any educational materials contained in this packet.  Follow-up instructions: Please follow-up with your primary care provider in the next 3 days for further evaluation of your symptoms and management of your asthma. Keep your upcoming appointment with the lung specialist.   Return instructions:   Please return to the Emergency Department if you experience worsening symptoms.  Please return with worsening wheezing, shortness of breath, or difficulty breathing.  Return with persistent fever above 101F.   Please return if you have any other emergent concerns.  Additional Information:  Your vital signs today were: BP 115/76 mmHg   Pulse 90   Temp(Src) 98.4 F (36.9 C) (Oral)   Resp 18   SpO2 100% If your blood pressure (BP) was elevated above 135/85 this visit, please have this repeated by your doctor within one month. --------------

## 2015-08-11 NOTE — ED Notes (Signed)
Patient talking in full sentences, NAD.  Patient states he has a profiled prescription at the pharmacy for singulair that he hasn't picked up yet.

## 2015-08-11 NOTE — ED Notes (Signed)
Pt here for asthma flare up. sts that he has been using everyday inhaler without relief. Pt in no acute distress.

## 2015-08-11 NOTE — ED Provider Notes (Signed)
CSN: [id number]     Arrival date & time 08/11/15  0915 History  By signing my name below, I, Sonum Patel, attest that this documentation has been prepared under the direction and in the presence of [employer]. Electronically Signed: Sonum Patel, Neurosurgeon. 08/11/2015. 9:33 AM.    Chief Complaint  Patient presents with  . Asthma   The history is provided by the patient. No language interpreter was used.     HPI Comments: Curtis Savage is a 55 y.o. male with past medical history of seasonal allergies, asthma who presents to the Emergency Department complaining of wheezing and chest tightness that began this morning after going outside. Patient states he believes it is related to the pollen. He uses an albuterol inhaler and Breo Ellipta; states he uses the inhaler 3-4 times each day and the Breo 1-2 times each day. He was seen at [facility] 6 days ago and was prescribed Singulair; states this hasn't been filled yet due to being on back order. He was referred to [facility] with an appointment next week.    Past Medical History  Diagnosis Date  . Seasonal allergies   . Asthma    Past Surgical History  Procedure Laterality Date  . Ankle surgery Right    History reviewed. No pertinent family history. Social History  Substance Use Topics  . Smoking status: Never Smoker   . Smokeless tobacco: None  . Alcohol Use: No    Review of Systems  Constitutional: Negative for fever, chills and fatigue.  HENT: Negative for congestion, ear pain, rhinorrhea, sinus pressure and sore throat.   Eyes: Negative for redness.  Respiratory: Positive for chest tightness and wheezing. Negative for cough.   Gastrointestinal: Negative for nausea, vomiting, abdominal pain and diarrhea.  Genitourinary: Negative for dysuria.  Musculoskeletal: Negative for myalgias and neck stiffness.  Skin: Negative for rash.  Neurological: Negative for headaches.  Hematological: Negative for  adenopathy.      Allergies  Tomato; Tramadol; and Clotrimazole  Home Medications   Prior to Admission medications   Medication Sig Start Date End Date Taking? Authorizing Provider  albuterol (PROVENTIL HFA;VENTOLIN HFA) 108 (90 Base) MCG/ACT inhaler Inhale 1-2 puffs into the lungs every 6 (six) hours as needed for wheezing. 06/22/15   Rolland Porter, MD  fluticasone furoate-vilanterol (BREO ELLIPTA) 200-25 MCG/INH AEPB Inhale 1 puff into the lungs daily. 07/29/15   Avanell Shackleton, NP  loratadine (CLARITIN) 10 MG tablet Take 1 tablet (10 mg total) by mouth daily. 08/08/11 02/16/14  Dayton Bailiff, MD  montelukast (SINGULAIR) 10 MG tablet Take 1 tablet (10 mg total) by mouth at bedtime. 07/29/15   Avanell Shackleton, NP  Multiple Vitamin (MULTIVITAMIN WITH MINERALS) TABS tablet Take 1 tablet by mouth daily.    Historical Provider, MD  Naphazoline HCl (CLEAR EYES OP) Place 2 drops into both eyes 2 (two) times daily.    Historical Provider, MD  naproxen (NAPROSYN) 500 MG tablet Take 1 tablet (500 mg total) by mouth 2 (two) times daily with a meal. 02/16/14   Kermit Balo Tysinger, PA-C   BP 115/76 mmHg  Pulse 90  Temp(Src) 98.4 F (36.9 C) (Oral)  Resp 18  SpO2 100%   Physical Exam  Constitutional: He is oriented to person, place, and time. He appears well-developed and well-nourished.  HENT:  Head: Normocephalic and atraumatic.  Nose: Mucosal edema and rhinorrhea present.  Eyes: Conjunctivae are normal. Right eye exhibits no discharge. Left eye exhibits  no discharge.  Neck: Normal range of motion. Neck supple.  Cardiovascular: Normal rate, regular rhythm and normal heart sounds.   Pulmonary/Chest: Effort normal. No respiratory distress. He has wheezes (Expiratory, scattered). He has no rales. He exhibits no tenderness.  Abdominal: Soft. There is no tenderness.  Neurological: He is alert and oriented to person, place, and time.  Skin: Skin is warm and dry.  Psychiatric: He has a normal mood and  affect.  Nursing note and vitals reviewed.   ED Course  Procedures (including critical care time)  DIAGNOSTIC STUDIES: Oxygen Saturation is 100% on RA, normal by my interpretation.    COORDINATION OF CARE: 9:54 AM Will give breathing treatment (albuterol, Atrovent) and prednisone 60 mg. Discussed treatment plan with pt at bedside and pt agreed to plan.   10:49 AM wheezing resolved and patient is breathing much easier. Will discharge to home with prednisone.  Patient urged to return with worsening symptoms or other concerns. Patient verbalized understanding and agrees with plan.    MDM   Final diagnoses:  Asthma exacerbation   Patient with clinical exam consistent with asthma exacerbation. Patient treated with breathing treatment in emergency department with much improvement. No chest pain suggestive of ACS, no other associated symptoms concerning for PE. Symptoms are same as previous with asthma flare.  I personally performed the services described in this documentation, which was scribed in my presence. The recorded information has been reviewed and is accurate.   Renne CriglerJoshua Holli Rengel, PA-C 08/11/15 1050  Benjiman CoreNathan Pickering, MD 08/11/15 1505

## 2015-08-17 ENCOUNTER — Encounter: Payer: Managed Care, Other (non HMO) | Admitting: Medical

## 2015-08-17 ENCOUNTER — Telehealth: Payer: Self-pay

## 2015-08-17 ENCOUNTER — Other Ambulatory Visit (INDEPENDENT_AMBULATORY_CARE_PROVIDER_SITE_OTHER): Payer: Managed Care, Other (non HMO)

## 2015-08-17 ENCOUNTER — Encounter: Payer: Self-pay | Admitting: Pulmonary Disease

## 2015-08-17 ENCOUNTER — Ambulatory Visit (INDEPENDENT_AMBULATORY_CARE_PROVIDER_SITE_OTHER): Payer: Managed Care, Other (non HMO) | Admitting: Pulmonary Disease

## 2015-08-17 VITALS — BP 114/80 | HR 71 | Ht 71.5 in | Wt 235.8 lb

## 2015-08-17 DIAGNOSIS — J4540 Moderate persistent asthma, uncomplicated: Secondary | ICD-10-CM

## 2015-08-17 DIAGNOSIS — Z0000 Encounter for general adult medical examination without abnormal findings: Secondary | ICD-10-CM

## 2015-08-17 LAB — CBC WITH DIFFERENTIAL/PLATELET
BASOS PCT: 0.8 % (ref 0.0–3.0)
Basophils Absolute: 0.1 10*3/uL (ref 0.0–0.1)
EOS PCT: 8.6 % — AB (ref 0.0–5.0)
Eosinophils Absolute: 0.8 10*3/uL — ABNORMAL HIGH (ref 0.0–0.7)
HCT: 46.3 % (ref 39.0–52.0)
HEMOGLOBIN: 15.3 g/dL (ref 13.0–17.0)
Lymphocytes Relative: 17.8 % (ref 12.0–46.0)
Lymphs Abs: 1.6 10*3/uL (ref 0.7–4.0)
MCHC: 32.9 g/dL (ref 30.0–36.0)
MCV: 81.1 fl (ref 78.0–100.0)
MONO ABS: 0.7 10*3/uL (ref 0.1–1.0)
MONOS PCT: 8.2 % (ref 3.0–12.0)
Neutro Abs: 5.7 10*3/uL (ref 1.4–7.7)
Neutrophils Relative %: 64.6 % (ref 43.0–77.0)
Platelets: 295 10*3/uL (ref 150.0–400.0)
RBC: 5.71 Mil/uL (ref 4.22–5.81)
RDW: 13.7 % (ref 11.5–15.5)
WBC: 8.9 10*3/uL (ref 4.0–10.5)

## 2015-08-17 LAB — NITRIC OXIDE: NITRIC OXIDE: 20

## 2015-08-17 MED ORDER — PANTOPRAZOLE SODIUM 40 MG PO TBEC: 40.0000 mg | DELAYED_RELEASE_TABLET | Freq: Every day | ORAL | Status: AC

## 2015-08-17 MED ORDER — FLUTICASONE FUROATE-VILANTEROL 200-25 MCG/INH IN AEPB: 1.0000 | INHALATION_SPRAY | Freq: Every day | RESPIRATORY_TRACT | Status: DC

## 2015-08-17 MED ORDER — FLUTICASONE FUROATE-VILANTEROL 200-25 MCG/INH IN AEPB: 1.0000 | INHALATION_SPRAY | Freq: Every day | RESPIRATORY_TRACT | Status: AC

## 2015-08-17 NOTE — Addendum Note (Signed)
Addended by: Charlott HollerKIDD, Deyanira Fesler W on: 08/17/2015 01:05 PM   Modules accepted: Orders

## 2015-08-17 NOTE — Addendum Note (Signed)
Addended by: Charlott HollerKIDD, Ivie Maese W on: 08/17/2015 11:55 AM   Modules accepted: Orders

## 2015-08-17 NOTE — Telephone Encounter (Signed)
This patient no showed for their appointment today.Which of the following is necessary for this patient.   A) No follow-up necessary   B) Follow-up urgent. Locate Patient Immediately.   C) Follow-up necessary. Contact patient and Schedule visit in ____ Days.   D) Follow-up Advised. Contact patient and Schedule visit in ____ Days. 

## 2015-08-17 NOTE — Progress Notes (Signed)
Subjective:    Patient ID: Curtis Savage, male    DOB: 1960/11/07, 55 y.o.   MRN: [id number]  HPI Consult for evaluation of asthma.  Curtis Savage is a 55 year old with history of childhood asthma. His symptoms are exacerbated during the fall and spring seasons and exposure to pollen. He's had a particularly bad time this year. He's been to the emergency room a couple of times and treated with prednisone taper. He was seen by his primary care doctor a couple weeks ago and started on by mouth ad lib., which helps with her symptoms however he is out off this inhaler currently. He is getting treated for his allergic rhinitis with Flonase, Claritin, Singulair. He also has history of GERD but is using only over-the-counter Tums for this.  DATA: Spirometry 07/29/15 FVC 2.75 (5%) FEV1 2.26 (58%] F/F 82 FEF 25-75 percent 64% Moderate restriction.  CXR 06/22/15-no acute cardiopulmonary pathology.  FeNo 08/17/15- 20  Social History: He is a never smoker, occasional alcohol, drug use. He works as a Engineer, water.  Family History:  Mother-allergies, asthma, heart disease, breast cancer.  Past Medical History  Diagnosis Date  . Seasonal allergies   . Asthma     Current outpatient prescriptions:  .  albuterol (PROVENTIL HFA;VENTOLIN HFA) 108 (90 Base) MCG/ACT inhaler, Inhale 1-2 puffs into the lungs every 6 (six) hours as needed for wheezing., Disp: 1 Inhaler, Rfl: 0 .  fluticasone furoate-vilanterol (BREO ELLIPTA) 200-25 MCG/INH AEPB, Inhale 1 puff into the lungs daily., Disp: 1 each, Rfl: 0 .  Multiple Vitamin (MULTIVITAMIN WITH MINERALS) TABS tablet, Take 1 tablet by mouth daily., Disp: , Rfl:  .  Naphazoline HCl (CLEAR EYES OP), Place 2 drops into both eyes 2 (two) times daily., Disp: , Rfl:  .  loratadine (CLARITIN) 10 MG tablet, Take 1 tablet (10 mg total) by mouth daily., Disp: 30 tablet, Rfl: 0 .  montelukast (SINGULAIR) 10 MG tablet, Take 1 tablet (10 mg total) by mouth at bedtime.,  Disp: 30 tablet, Rfl: 3   Review of Systems Cough, dyspnea on exertion, wheezing. No hemoptysis. No fevers, chills. No chest pain, palpitations. No nausea, vomiting, diarrhea, consultation. All other review of systems negative    Objective:   Physical Exam Blood pressure 114/80, pulse 71, height 5' 11.5" (1.816 m), weight 235 lb 12.8 oz (106.958 kg), SpO2 99 %. Gen: No apparent distress Neuro: No gross focal deficits. HEENT: No JVD, lymphadenopathy, thyromegaly. RS: Clear, No wheeze or crackles CVS: S1-S2 heard, no murmurs rubs gallops. Abdomen: Soft, positive bowel sounds. Musculoskeletal: No edema.    Assessment & Plan:  Moderate persistent asthma Seasonal allergies/allergic rhinitis, postnasal drip. GERD  Mr. Drost has had multiple exacerbations set off by high pollen content in the environment. He is just coming off his prednisone taper. He actually feels well in the office today and is not wheezing. His FENO is only 20 and I dont think he'll need another course of prednisone. He has responded to the [place] and we will continue the same. He's also maximally treated for allergic rhinitis with Flonase, Claritin, Singulair. I will start him on Protonix for GERD. He will get labs today for CBC with diff and IgE levels. PFTs at next visit.   Plan: - Renew prescription for Breo - Start protonix 40 mg daily - Continue flonase, singulair, claritin. - Check CBC with diff, IgE levels.  Return in 1 month.  Chilton Greathouse MD [facility] Pager [phone] If  no answer or after 3pm call: [phone] 08/17/2015, 11:46 AM

## 2015-08-17 NOTE — Addendum Note (Signed)
Addended by: Charlott HollerKIDD, Bonnie Roig W on: 08/17/2015 12:02 PM   Modules accepted: Orders

## 2015-08-17 NOTE — Addendum Note (Signed)
Addended by: Charlott HollerKIDD, Della Scrivener W on: 08/17/2015 12:00 PM   Modules accepted: Orders

## 2015-08-17 NOTE — Patient Instructions (Signed)
We will give a prescription for breo elipta 200-25. We will start Protonix 40 mg daily for acid reflux. Continue using the Flonase, Claritin, Singulair.  We will check FenO levels today and send for blood work including CBC with diff, IgE levels.  Return in 1 month.

## 2015-08-18 LAB — IGE: IGE (IMMUNOGLOBULIN E), SERUM: 900 kU/L — AB (ref <115)

## 2015-08-18 NOTE — Telephone Encounter (Signed)
LMTCB

## 2015-08-18 NOTE — Telephone Encounter (Signed)
D

## 2015-08-18 NOTE — Telephone Encounter (Signed)
Pt stated that he forgot but will call back later to reschedule and is aware he will receive a bill

## 2015-08-20 ENCOUNTER — Encounter: Payer: Self-pay | Admitting: Medical

## 2015-08-20 NOTE — Telephone Encounter (Signed)
No show [facility] no show [facility]

## 2015-09-17 ENCOUNTER — Ambulatory Visit: Payer: Managed Care, Other (non HMO) | Admitting: Pulmonary Disease

## 2015-09-21 ENCOUNTER — Ambulatory Visit: Payer: Managed Care, Other (non HMO) | Admitting: Pulmonary Disease

## 2016-03-24 ENCOUNTER — Emergency Department (HOSPITAL_COMMUNITY)
Admission: EM | Admit: 2016-03-24 | Discharge: 2016-03-24 | Discharge status: Against Medical Advice | Payer: Managed Care, Other (non HMO)

## 2016-07-02 ENCOUNTER — Encounter (HOSPITAL_BASED_OUTPATIENT_CLINIC_OR_DEPARTMENT_OTHER): Payer: Self-pay | Admitting: *Deleted

## 2016-07-02 ENCOUNTER — Emergency Department (HOSPITAL_BASED_OUTPATIENT_CLINIC_OR_DEPARTMENT_OTHER)
Admission: EM | Admit: 2016-07-02 | Discharge: 2016-07-02 | Disposition: A | Discharge status: Home / Self Care | Payer: Managed Care, Other (non HMO) | Attending: Emergency Medicine | Admitting: Emergency Medicine

## 2016-07-02 DIAGNOSIS — Z79899 Other long term (current) drug therapy: Secondary | ICD-10-CM | POA: Insufficient documentation

## 2016-07-02 DIAGNOSIS — M25572 Pain in left ankle and joints of left foot: Secondary | ICD-10-CM

## 2016-07-02 DIAGNOSIS — J45909 Unspecified asthma, uncomplicated: Secondary | ICD-10-CM | POA: Insufficient documentation

## 2016-07-02 MED ORDER — TRAMADOL HCL 50 MG PO TABS: 50.0000 mg | ORAL_TABLET | Freq: Four times a day (QID) | ORAL | 0 refills | Status: AC | PRN

## 2016-07-02 MED ORDER — NAPROXEN 500 MG PO TABS: 500.0000 mg | ORAL_TABLET | Freq: Two times a day (BID) | ORAL | 0 refills | Status: AC

## 2016-07-02 NOTE — ED Triage Notes (Signed)
Pt reports L ankle pain x several months. Denies known injury. Reports L leg swelling; denies sob, chest pain, fever, n/v/d, numbness/tingling. Reports taking 1000mg  Tylenol around 0500. Pt able to bear weight on foot.

## 2016-07-02 NOTE — ED Provider Notes (Signed)
[facility]   CSN: [id number] Arrival date & time: 07/02/16  [id number]     History   Chief Complaint Chief Complaint  Patient presents with  . Ankle Pain    HPI Curtis Savage is a 56 y.o. 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 Patient denies any trauma or injury. Patient has pain especially with movement and standing for long periods of time. Patient has had associated swelling of his lower extremities. He states that he has not noticed any asymmetry. He has taken Tylenol for his symptoms with some relief. Patient states he also elevates his legs when he can. He denies any recent immobilizations or surgeries. Patient states his symptoms feel the same as his last Achilles problem. [facility]. Patient denies any chest pain or shortness of breath. Patient reports intermittent paresthesias to his left toes, but no complete numbness and none at this time.  HPI  Past Medical History:  Diagnosis Date  . Asthma   . Seasonal allergies     There are no active problems to display for this patient.   Past Surgical History:  Procedure Laterality Date  . ANKLE SURGERY Right        Home Medications    Prior to Admission medications   Medication Sig Start Date End Date Taking? Authorizing Provider  fluticasone furoate-vilanterol (BREO ELLIPTA) 200-25 MCG/INH AEPB Inhale 1 puff into the lungs daily. 08/17/15  Yes Praveen Mannam, MD  loratadine (CLARITIN) 10 MG tablet Take 1 tablet (10 mg total) by mouth daily. 08/08/11 07/02/16 Yes [place] BailiffAndrew King, MD  Multiple Vitamin (MULTIVITAMIN WITH MINERALS) TABS tablet Take 1 tablet by mouth daily.   Yes Historical Provider, MD  Naphazoline HCl (CLEAR EYES OP) Place 2 drops into both eyes 2 (two) times daily.   Yes Historical Provider, MD  albuterol (PROVENTIL HFA;VENTOLIN HFA) 108 (90 Base) MCG/ACT inhaler Inhale 1-2  puffs into the lungs every 6 (six) hours as needed for wheezing. 06/22/15   Rolland PorterMark James, MD  montelukast (SINGULAIR) 10 MG tablet Take 1 tablet (10 mg total) by mouth at bedtime. 07/29/15   Vickie L Henson, NP-C  naproxen (NAPROSYN) 500 MG tablet Take 1 tablet (500 mg total) by mouth 2 (two) times daily. 07/02/16   Emi HolesAlexandra M Nareg Breighner, PA-C  pantoprazole (PROTONIX) 40 MG tablet Take 1 tablet (40 mg total) by mouth daily. 08/17/15   Praveen Mannam, MD  traMADol (ULTRAM) 50 MG tablet Take 1 tablet (50 mg total) by mouth every 6 (six) hours as needed for severe pain. 07/02/16   Emi HolesAlexandra M Sady Monaco, PA-C    Family History Family History  Problem Relation Age of Onset  . Asthma Mother   . Allergies Mother   . Heart disease Mother   . Breast cancer Mother     Social History Social History  Substance Use Topics  . Smoking status: Never Smoker  . Smokeless tobacco: Never Used  . Alcohol use 0.0 oz/week     Comment: occ     Allergies   Tomato; Tramadol; and Clotrimazole   Review of Systems Review of Systems  Constitutional: Negative for chills and fever.  HENT: Negative for facial swelling and sore throat.   Respiratory: Negative for shortness of breath.   Cardiovascular: Negative for chest pain.  Gastrointestinal: Negative for abdominal pain, nausea and vomiting.  Genitourinary: Negative for dysuria.  Musculoskeletal:  Positive for arthralgias (L ankle) and myalgias. Negative for back pain.  Skin: Negative for rash and wound.  Neurological: Negative for headaches.  Psychiatric/Behavioral: The patient is not nervous/anxious.      Physical Exam Updated Vital Signs BP 113/80 (BP Location: Right Arm)   Pulse 77   Temp 97.8 F (36.6 C) (Oral)   Resp 16   Ht 5\' 11"  (1.803 m)   Wt 106.1 kg   SpO2 100%   BMI 32.64 kg/m   Physical Exam  Constitutional: He appears well-developed and well-nourished. No distress.  HENT:  Head: Normocephalic and atraumatic.  Mouth/Throat: Oropharynx is clear  and moist. No oropharyngeal exudate.  Eyes: Conjunctivae are normal. Pupils are equal, round, and reactive to light. Right eye exhibits no discharge. Left eye exhibits no discharge. No scleral icterus.  Neck: Normal range of motion. Neck supple. No thyromegaly present.  Cardiovascular: Normal rate, regular rhythm, normal heart sounds and intact distal pulses.  Exam reveals no gallop and no friction rub.   No murmur heard. Pulmonary/Chest: Effort normal and breath sounds normal. No stridor. No respiratory distress. He has no wheezes. He has no rales.  Abdominal: Soft. Bowel sounds are normal. He exhibits no distension. There is no tenderness. There is no rebound and no guarding.  Musculoskeletal: He exhibits no edema.       Left ankle: He exhibits decreased range of motion (5/5 strength with plantar and dorsiflexion with some pain). He exhibits no swelling and normal pulse. No lateral malleolus, no medial malleolus, no head of 5th metatarsal and no proximal fibula tenderness found. Achilles tendon exhibits pain. Achilles tendon exhibits no defect and normal Thompson's test results.       Left lower leg: He exhibits no swelling.       Legs:      Feet:  No bony tenderness  Lymphadenopathy:    He has no cervical adenopathy.  Neurological: He is alert. Coordination normal.  Normal sensation to bilateral lower extremities  Skin: Skin is warm and dry. No rash noted. He is not diaphoretic. No pallor.  Psychiatric: He has a normal mood and affect.  Nursing note and vitals reviewed.    ED Treatments / Results  Labs (all labs ordered are listed, but only abnormal results are displayed) Labs Reviewed - No data to display  EKG  EKG Interpretation None       Radiology No results found.  Procedures Procedures (including critical care time)  Medications Ordered in ED Medications - No data to display   Initial Impression / Assessment and Plan / ED Course  I have reviewed the triage  vital signs and the nursing notes.  Pertinent labs & imaging results that were available during my care of the patient were reviewed by me and considered in my medical decision making (see chart for details).     Patient with probable Achilles tendinitis or injury. Negative Thompson tests gives low suspicion for complete rupture. Doubt DVT, as patient has point tenderness to Achilles, and no erythema or swelling. Wells score -2.Normal sensation and pulses intact. Will place patient in cam walker boot and crutches with follow-up to orthopedics. Patient given strict return precautions. Patient given Naprosyn for pain control and short course of tramadol for severe pain. I reviewed narcotic database and found no discrepancies. Patient vitals stable. ED course and discharged in satisfactory condition. I discussed patient case with Dr. Adela Lank who guided the patient's management and agrees with plan.   Final Clinical Impressions(s) /  ED Diagnoses   Final diagnoses:  Acute left ankle pain    New Prescriptions Discharge Medication List as of 07/02/2016 10:41 AM    START taking these medications   Details  naproxen (NAPROSYN) 500 MG tablet Take 1 tablet (500 mg total) by mouth 2 (two) times daily., Starting Sun 07/02/2016, Print         Emi Holes, PA-C 07/02/16 1522    Melene Plan, DO 07/04/16 1458

## 2016-07-02 NOTE — Discharge Instructions (Signed)
Medications: Naprosyn  Treatment: Take Naprosyn twice daily for your pain. You can also take Tylenol as prescribed over-the-counter. Use ice 3-4 times daily alternate 20 minutes on, 20 minutes off. Keep the leg elevated when you're not walking on it. Wear your boot and crutches with ambulation.  Follow-up: Please follow-up with Dr. Aundria Rudogers, an orthopedic doctor, for further evaluation and treatment of your symptoms. Please return to the emergency department if you develop any new or worsening symptoms including unilateral swelling of the leg, development of redness in her calf, pain in your calf that is moving up, chest pain, or shortness of breath.

## 2016-11-20 ENCOUNTER — Encounter (HOSPITAL_COMMUNITY): Payer: Self-pay

## 2016-11-20 ENCOUNTER — Emergency Department (HOSPITAL_COMMUNITY)
Admission: EM | Admit: 2016-11-20 | Discharge: 2016-11-20 | Disposition: A | Discharge status: Home / Self Care | Payer: Self-pay | Attending: Emergency Medicine | Admitting: Emergency Medicine

## 2016-11-20 ENCOUNTER — Emergency Department (HOSPITAL_COMMUNITY): Payer: Self-pay

## 2016-11-20 DIAGNOSIS — M545 Low back pain: Secondary | ICD-10-CM

## 2016-11-20 DIAGNOSIS — Z79899 Other long term (current) drug therapy: Secondary | ICD-10-CM | POA: Insufficient documentation

## 2016-11-20 DIAGNOSIS — J45909 Unspecified asthma, uncomplicated: Secondary | ICD-10-CM | POA: Insufficient documentation

## 2016-11-20 MED ORDER — HYDROCODONE-ACETAMINOPHEN 5-325 MG PO TABS: 1.0000 | ORAL_TABLET | Freq: Four times a day (QID) | ORAL | 0 refills | Status: AC | PRN

## 2016-11-20 MED ORDER — METHOCARBAMOL 500 MG PO TABS: 500.0000 mg | ORAL_TABLET | Freq: Two times a day (BID) | ORAL | 0 refills | Status: AC | PRN

## 2016-11-20 MED ORDER — OXYCODONE-ACETAMINOPHEN 5-325 MG PO TABS
1.0000 | ORAL_TABLET | Freq: Once | ORAL | Status: AC
  Administered 2016-11-20: 1 via ORAL
  Filled 2016-11-20: qty 1

## 2016-11-20 MED ORDER — IBUPROFEN 600 MG PO TABS: 600.0000 mg | ORAL_TABLET | Freq: Four times a day (QID) | ORAL | 0 refills | Status: AC | PRN

## 2016-11-20 MED ORDER — METHOCARBAMOL 500 MG PO TABS
500.0000 mg | ORAL_TABLET | Freq: Once | ORAL | Status: AC
  Administered 2016-11-20: 500 mg via ORAL
  Filled 2016-11-20: qty 1

## 2016-11-20 NOTE — Discharge Instructions (Signed)
It was my pleasure taking care of you today!   Robaxin is your muscle relaxer to take as needed. Ibuprofen for mild to moderate pain.   Reserve pain medication for severe pain.   Do not drink alcohol, drive or participate in any other potentially dangerous activities while taking opiate pain medication as it may make you sleepy. Do not take this medication with any other sedating medications, either prescription or over-the-counter. If you were prescribed Percocet or Vicodin, do not take these with acetaminophen (Tylenol) as it is already contained within these medications.   This medication is an opiate (or narcotic) pain medication and can be habit forming.  Use it as little as possible to achieve adequate pain control.  Do not use or use it with extreme caution if you have a history of opiate abuse or dependence. This medication is intended for your use only - do not give any to anyone else and keep it in a secure place where nobody else, especially children, have access to it. It will also cause or worsen constipation, so you may want to consider taking an over-the-counter stool softener while you are taking this medication.   Keep your scheduled appointment on the 18th.  Return to ER for new or worsening symptoms, any additional concerns.

## 2016-11-20 NOTE — ED Triage Notes (Signed)
PT reports right side lower back pain x months that radiated down right leg all the way to his toes. Denies known injury. Pt states when pain is the worst he sometimes has to drag the leg.

## 2016-11-20 NOTE — ED Notes (Signed)
Patient Alert and oriented X4. Stable and ambulatory. Patient verbalized understanding of the discharge instructions.  Patient belongings were taken by the patient.  

## 2016-11-20 NOTE — ED Notes (Signed)
Pt ambulated in hallway w/o difficulty 

## 2016-11-20 NOTE — ED Provider Notes (Signed)
[facility]-EMER[facility]Y DEPT Provider Note   CSN: [id number] Arrival date & time: 11/20/16  1217     History   Chief Complaint Chief Complaint  Patient presents with  . Back Pain    HPI Curtis Nordmannlonza Savage is a 56 y.o. male.  The history is provided by the patient and medical records. No language interpreter was used.  Back Pain   Pertinent negatives include no fever, no numbness and no weakness.   Curtis Savage is a 56 y.o. male  with a PMH of asthma who presents to the Emergency Department complaining of right lower back pain pain for 4-5 months which acutely worsened over the last 3-4 days. With certain movements, a sharp pain will radiate down right leg all the way down to toes. Tried OTC pain meds with no improvement. Never been evaluated by PCP or specialist for this. Patient denies upper back or neck pain. No fever, saddle anesthesia, weakness, numbness, urinary complaints including retention/incontinence. No history of cancer, IVDU, or recent spinal procedures. Has an appointment with doctor on the 18th for similar as he is trying to get on disability.    Past Medical History:  Diagnosis Date  . Asthma   . Seasonal allergies     There are no active problems to display for this patient.   Past Surgical History:  Procedure Laterality Date  . ANKLE SURGERY Right        Home Medications    Prior to Admission medications   Medication Sig Start Date End Date Taking? Authorizing Provider  albuterol (PROVENTIL HFA;VENTOLIN HFA) 108 (90 Base) MCG/ACT inhaler Inhale 1-2 puffs into the lungs every 6 (six) hours as needed for wheezing. 06/22/15   Rolland PorterJames, Mark, MD  fluticasone furoate-vilanterol (BREO ELLIPTA) 200-25 MCG/INH AEPB Inhale 1 puff into the lungs daily. 08/17/15   Mannam, Colbert CoyerPraveen, MD  HYDROcodone-acetaminophen (NORCO) 5-325 MG tablet Take 1 tablet by mouth every 6 (six) hours as needed for moderate pain. 11/20/16   Ward, Chase PicketJaime Pilcher, PA-C  ibuprofen (ADVIL,MOTRIN) 600 MG tablet  Take 1 tablet (600 mg total) by mouth every 6 (six) hours as needed. 11/20/16   Ward, Chase PicketJaime Pilcher, PA-C  loratadine (CLARITIN) 10 MG tablet Take 1 tablet (10 mg total) by mouth daily. 08/08/11 07/02/16  [place] BailiffKing, Andrew, MD  methocarbamol (ROBAXIN) 500 MG tablet Take 1 tablet (500 mg total) by mouth 2 (two) times daily as needed for muscle spasms. 11/20/16   Ward, Chase PicketJaime Pilcher, PA-C  montelukast (SINGULAIR) 10 MG tablet Take 1 tablet (10 mg total) by mouth at bedtime. 07/29/15   Henson, Vickie L, NP-C  Multiple Vitamin (MULTIVITAMIN WITH MINERALS) TABS tablet Take 1 tablet by mouth daily.    [provider]  Naphazoline HCl (CLEAR EYES OP) Place 2 drops into both eyes 2 (two) times daily.    [provider]  naproxen (NAPROSYN) 500 MG tablet Take 1 tablet (500 mg total) by mouth 2 (two) times daily. 07/02/16   Law, Waylan BogaAlexandra M, PA-C  pantoprazole (PROTONIX) 40 MG tablet Take 1 tablet (40 mg total) by mouth daily. 08/17/15   Mannam, Colbert CoyerPraveen, MD  traMADol (ULTRAM) 50 MG tablet Take 1 tablet (50 mg total) by mouth every 6 (six) hours as needed for severe pain. 07/02/16   Emi HolesLaw, Alexandra M, PA-C    Family History Family History  Problem Relation Age of Onset  . Asthma Mother   . Allergies Mother   . Heart disease Mother   . Breast cancer Mother  Social History Social History  Substance Use Topics  . Smoking status: Never Smoker  . Smokeless tobacco: Never Used  . Alcohol use 0.0 oz/week     Comment: occ     Allergies   Tomato; Tramadol; and Clotrimazole   Review of Systems Review of Systems  Constitutional: Negative for chills and fever.  Musculoskeletal: Positive for back pain. Negative for neck pain.  Skin: Negative for wound.  Neurological: Negative for weakness and numbness.     Physical Exam Updated Vital Signs BP 95/74 (BP Location: Right Arm)   Pulse (!) 56   Temp 97.8 F (36.6 C) (Oral)   Resp 18   Ht 5' 11.5" (1.816 m)   Wt 108 kg (238 lb)   SpO2  100%   BMI 32.73 kg/m   Physical Exam  Constitutional: He is oriented to person, place, and time. He appears well-developed and well-nourished.  Neck:  No midline or paraspinal tenderness. Full ROM without pain.  Cardiovascular: Normal rate, regular rhythm, normal heart sounds and intact distal pulses.   Pulmonary/Chest: Effort normal and breath sounds normal. No respiratory distress.  Abdominal: Soft. Bowel sounds are normal. He exhibits no distension. There is no tenderness.  Musculoskeletal:  Tenderness to palpation as depicted in iamge. 5/5 muscle strength in bilateral LE's. Straight leg raises are  negative bilaterally for radicular symptoms. Right SLR does reproduce back pain.  Neurological: He is alert and oriented to person, place, and time. He has normal reflexes.  Bilateral lower extremities neurovascularly intact. Intact and equal sensation throughout.  Skin: Skin is warm and dry. No rash noted. No erythema.  Nursing note and vitals reviewed.    ED Treatments / Results  Labs (all labs ordered are listed, but only abnormal results are displayed) Labs Reviewed - No data to display  EKG  EKG Interpretation None       Radiology Dg Lumbar Spine Complete  Result Date: 11/20/2016 CLINICAL DATA:  Low back pain for 2 days.  No injury. EXAM: LUMBAR SPINE - COMPLETE 4+ VIEW COMPARISON:  None. FINDINGS: Five lumbar type vertebral bodies. Sacroiliac joints are symmetric. Borderline prominent gas-filled small bowel loops throughout. Facet arthropathy involves L5-S1 and less so L4-5. Maintenance of vertebral body height and alignment. Intervertebral disc heights are maintained. IMPRESSION: Minimal lumbar spondylosis, without acute osseous finding. Borderline prominent gas-filled small bowel loops. Correlate with any gastrointestinal complaints. Electronically Signed   By: Jeronimo Greaves M.D.   On: 11/20/2016 14:06    Procedures Procedures (including critical care time)  Medications  Ordered in ED Medications  oxyCODONE-acetaminophen (PERCOCET/ROXICET) 5-325 MG per tablet 1 tablet (1 tablet Oral Given 11/20/16 1425)  methocarbamol (ROBAXIN) tablet 500 mg (500 mg Oral Given 11/20/16 1425)     Initial Impression / Assessment and Plan / ED Course  I have reviewed the triage vital signs and the nursing notes.  Pertinent labs & imaging results that were available during my care of the patient were reviewed by me and considered in my medical decision making (see chart for details).      Curtis Savage is a 56 y.o. male who presents to ED for mid to right-sided low back pain for the last 4-5 months. Patient demonstrates no lower extremity weakness, saddle anesthesia, bowel or bladder incontinence or neuro deficits. No concern for cauda equina. No fevers or other infectious symptoms to suggest that the patient's back pain is due to an infection. Lower extremities are neurovascularly intact and following pain control, patient able to  ambulate independently. I have reviewed return precautions, including the development of any of these signs or symptoms and the patient has voiced understanding. Patient has appointment on 8/18 for same already scheduled. Encouraged to keep this appointment. [place] Controlled Substance Database consulted with no rx listed in the last 6 months. Will give short course pain meds for severe pain, ibuprofen for mild/moderate. Robaxin PRN. Again stressed follow up / return precautions. Patient voiced understanding and agreement with plan. All questions answered.   Final Clinical Impressions(s) / ED Diagnoses   Final diagnoses:  Right low back pain, unspecified chronicity, with sciatica presence unspecified    New Prescriptions New Prescriptions   HYDROCODONE-ACETAMINOPHEN (NORCO) 5-325 MG TABLET    Take 1 tablet by mouth every 6 (six) hours as needed for moderate pain.   IBUPROFEN (ADVIL,MOTRIN) 600 MG TABLET    Take 1 tablet (600 mg total) by mouth every 6  (six) hours as needed.   METHOCARBAMOL (ROBAXIN) 500 MG TABLET    Take 1 tablet (500 mg total) by mouth 2 (two) times daily as needed for muscle spasms.     Ward, Chase Picket, PA-C 11/20/16 1513    Geoffery Lyons, MD 11/20/16 [phone]

## 2018-04-26 ENCOUNTER — Telehealth: Payer: Self-pay | Admitting: Medical

## 2018-04-26 NOTE — Telephone Encounter (Signed)
Dismissal letter in guarantor snapshot  °

## 2018-06-25 IMAGING — CR DG LUMBAR SPINE COMPLETE 4+V
5 series · 5 of 5 positions shown · non-contrast
Comparison: None.

CLINICAL DATA: Low back pain for 2 days.  No injury.

EXAM:
LUMBAR SPINE - COMPLETE 4+ VIEW

[l-spine ap]
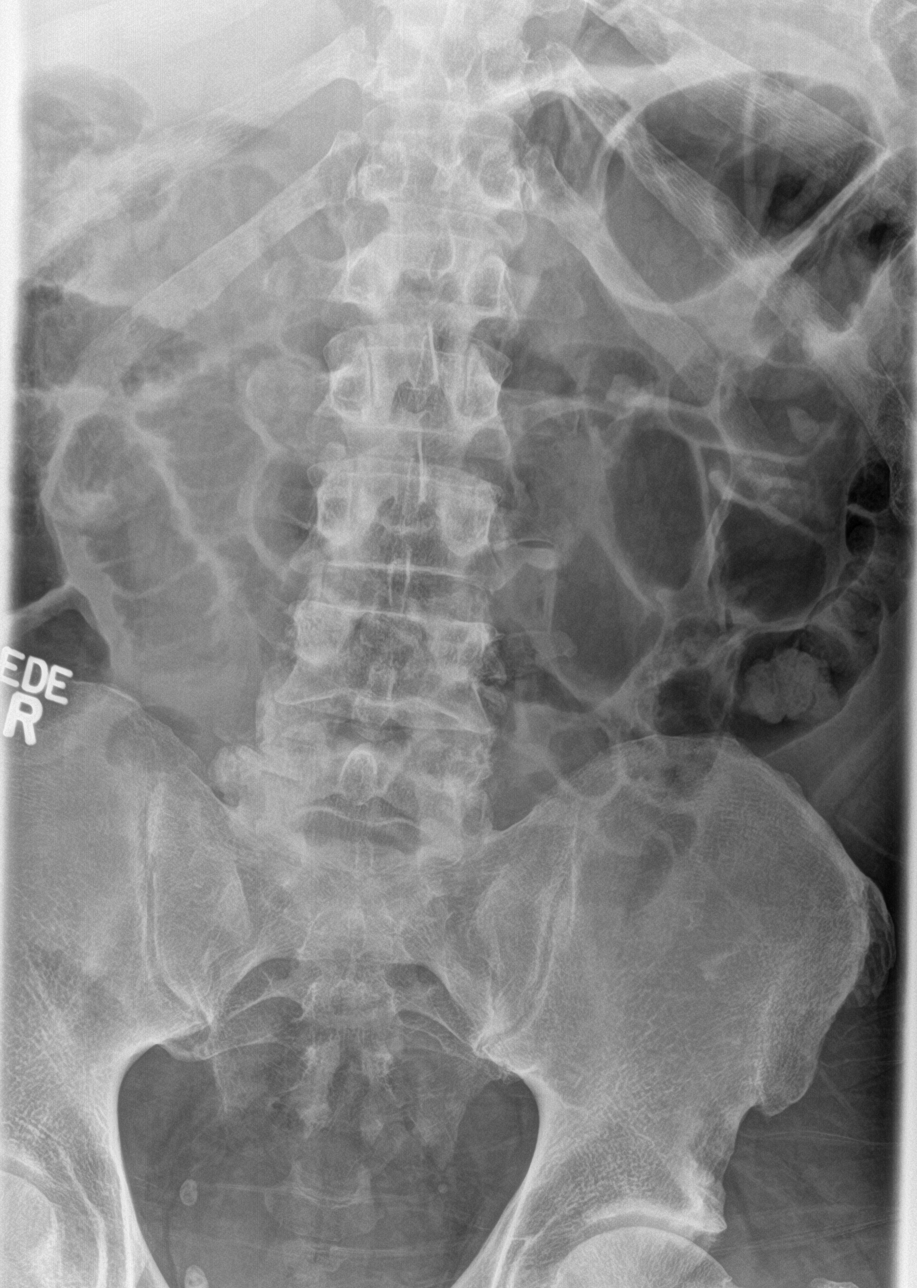

[l-spine obl (1 of 2)]
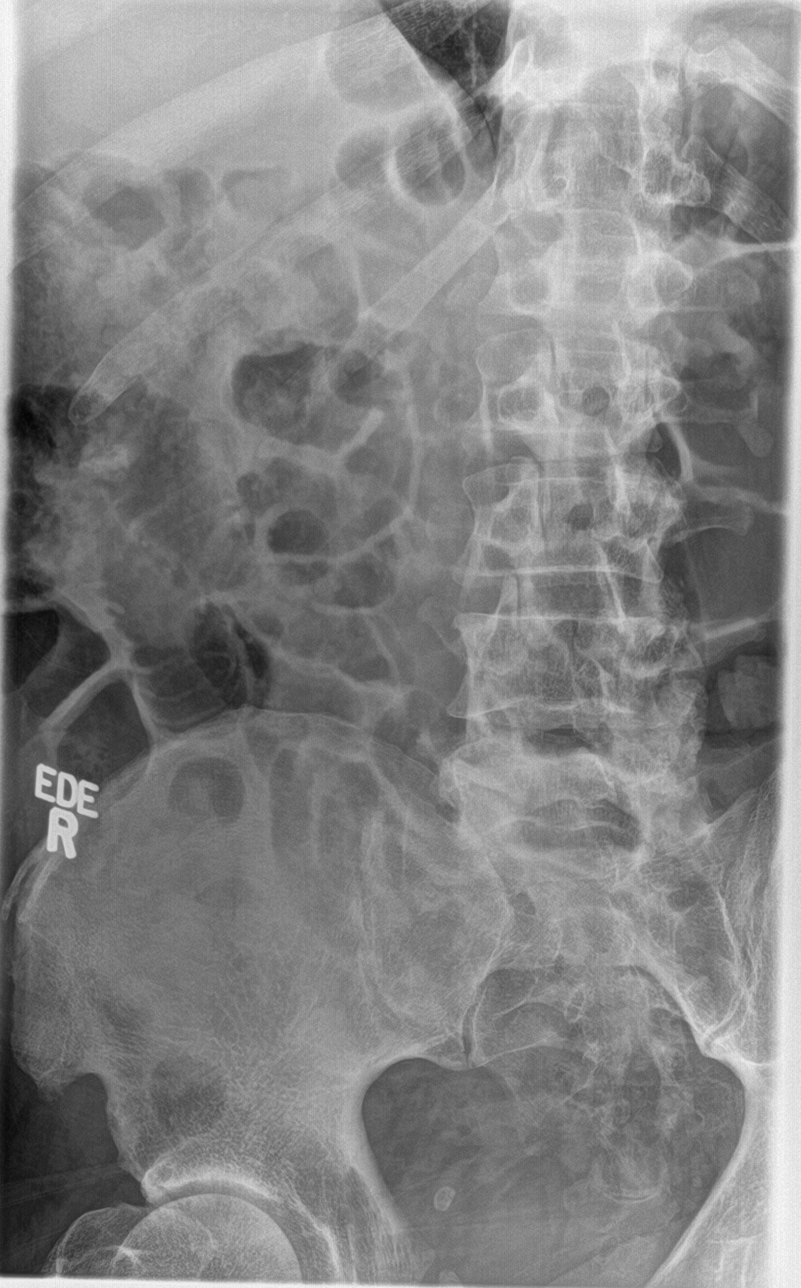

[l-spine obl (2 of 2)]
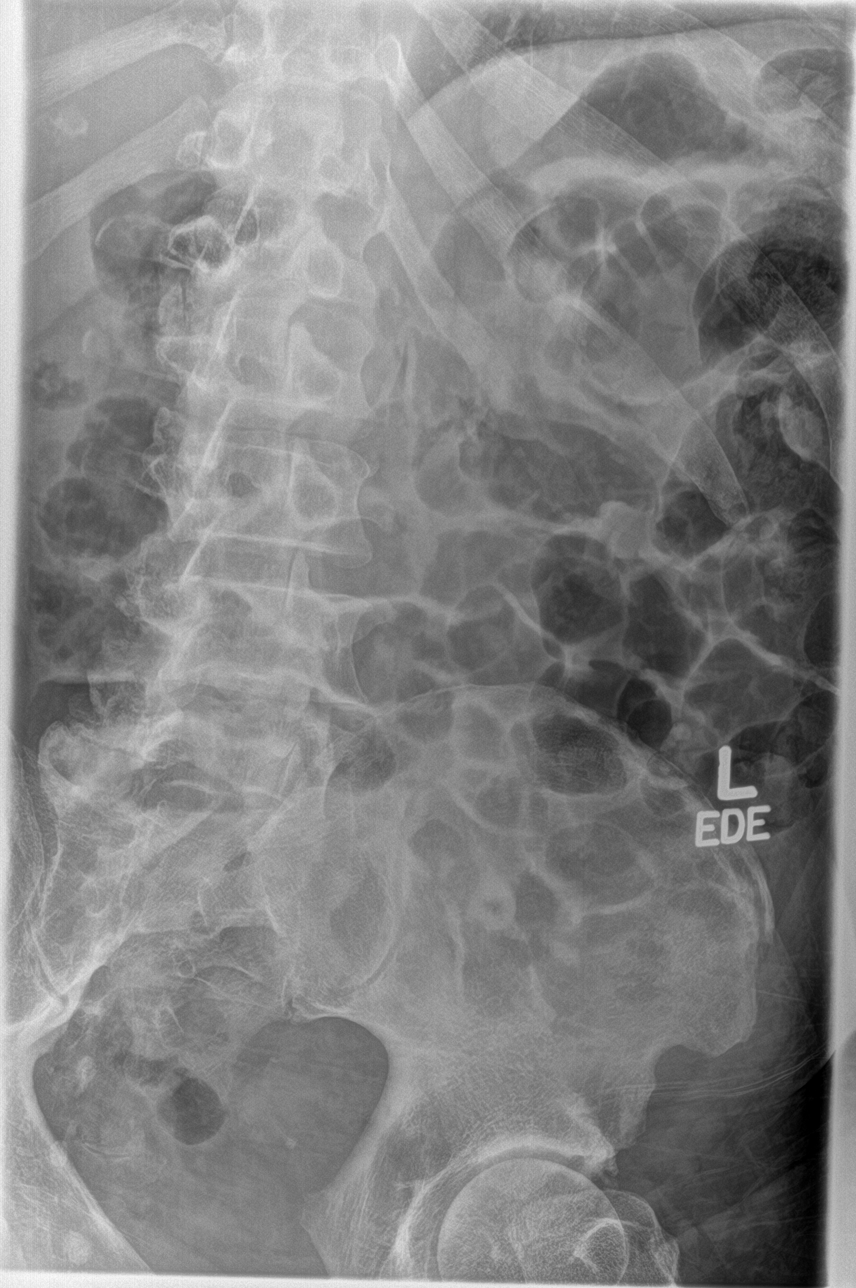

[l-spine lat]
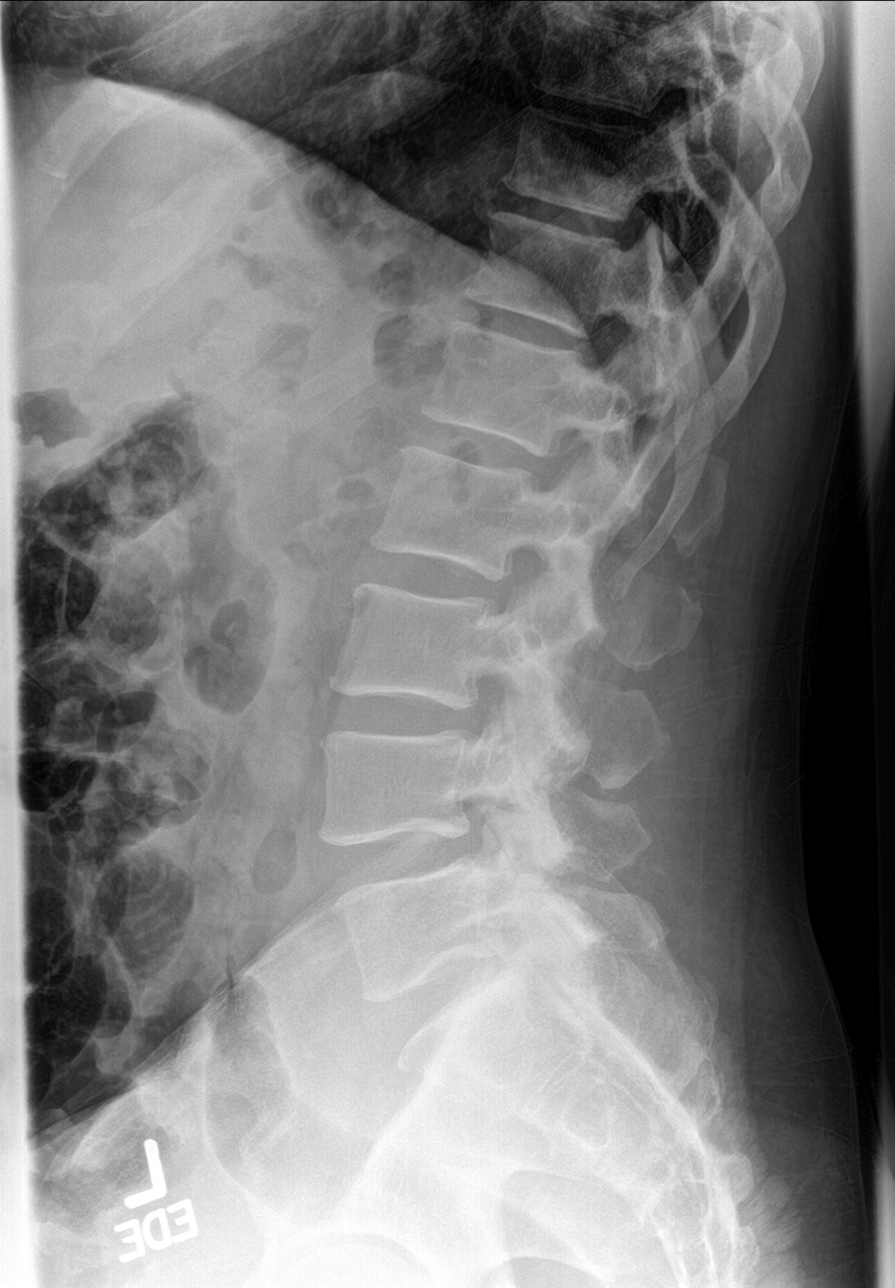

[l-spine spot]
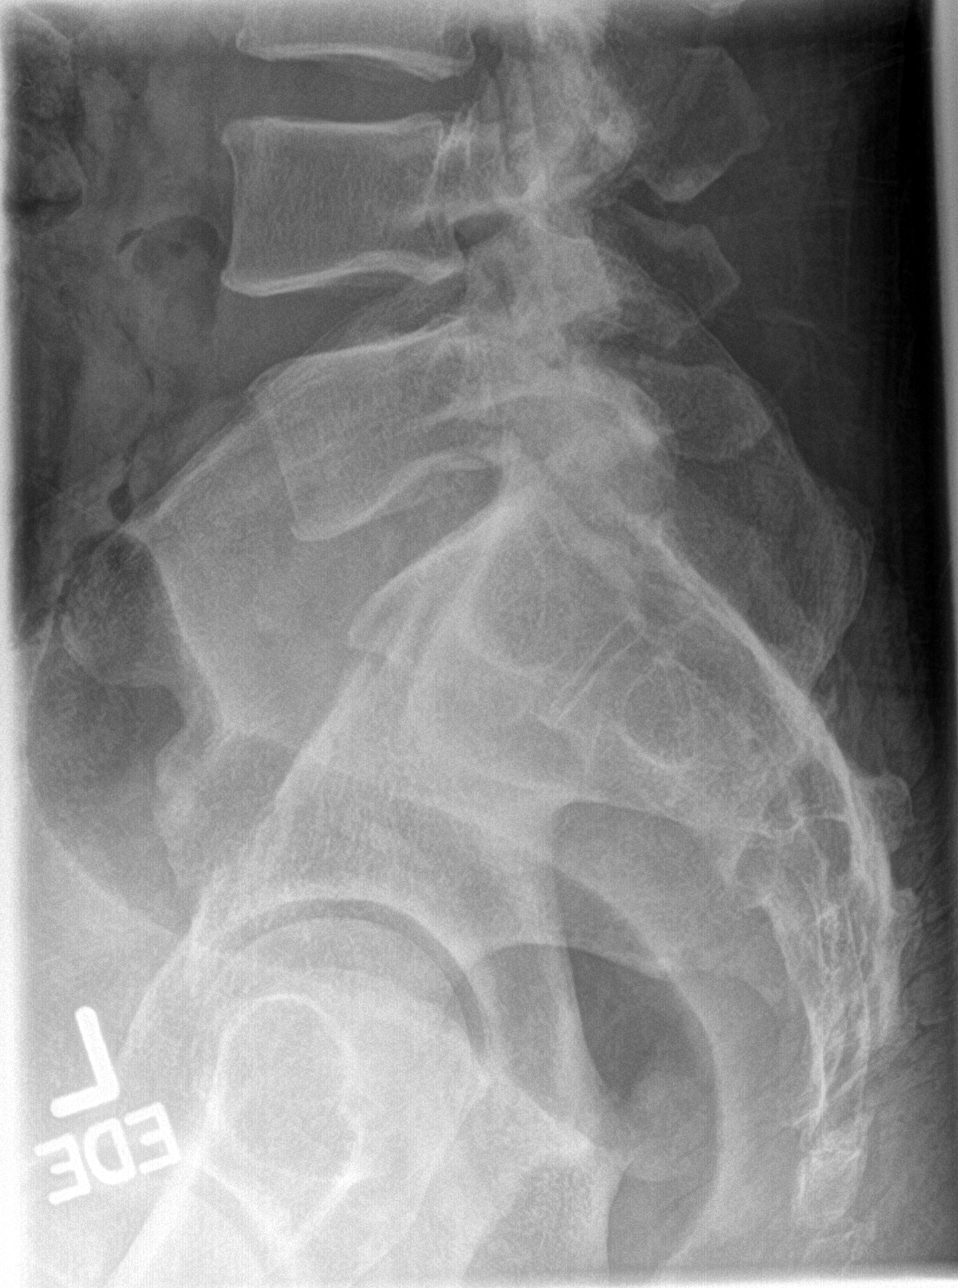

[5 of 5 positions shown; findings below may reference images not displayed]

FINDINGS: Five lumbar type vertebral bodies. Sacroiliac joints are symmetric.
Borderline prominent gas-filled small bowel loops throughout. Facet
arthropathy involves L5-S1 and less so L4-5. Maintenance of
vertebral body height and alignment. Intervertebral disc heights are
maintained.
IMPRESSION: Minimal lumbar spondylosis, without acute osseous finding.

Borderline prominent gas-filled small bowel loops. Correlate with
any gastrointestinal complaints.

## 2023-07-26 ENCOUNTER — Encounter (HOSPITAL_COMMUNITY): Payer: Self-pay

## 2023-07-26 ENCOUNTER — Emergency Department (HOSPITAL_COMMUNITY): Payer: Self-pay

## 2023-07-26 ENCOUNTER — Other Ambulatory Visit: Payer: Self-pay

## 2023-07-26 ENCOUNTER — Emergency Department (HOSPITAL_COMMUNITY)
Admission: EM | Admit: 2023-07-26 | Discharge: 2023-07-26 | Disposition: A | Discharge status: Home / Self Care | Payer: Self-pay | Attending: Emergency Medicine | Admitting: Emergency Medicine

## 2023-07-26 DIAGNOSIS — J45901 Unspecified asthma with (acute) exacerbation: Secondary | ICD-10-CM | POA: Insufficient documentation

## 2023-07-26 DIAGNOSIS — R0602 Shortness of breath: Secondary | ICD-10-CM

## 2023-07-26 MED ORDER — DOXYCYCLINE HYCLATE 100 MG PO CAPS
100.0000 mg | ORAL_CAPSULE | Freq: Two times a day (BID) | ORAL | 0 refills | Status: DC
Start: 2023-07-26 — End: 2023-07-26

## 2023-07-26 MED ORDER — IPRATROPIUM-ALBUTEROL 0.5-2.5 (3) MG/3ML IN SOLN
3.0000 mL | Freq: Once | RESPIRATORY_TRACT | Status: AC
  Administered 2023-07-26: 3 mL via RESPIRATORY_TRACT
  Filled 2023-07-26: qty 3

## 2023-07-26 MED ORDER — ALBUTEROL SULFATE HFA 108 (90 BASE) MCG/ACT IN AERS
2.0000 | INHALATION_SPRAY | RESPIRATORY_TRACT | Status: DC | PRN
Start: 2023-07-26 — End: 2023-07-26
  Administered 2023-07-26: 2 via RESPIRATORY_TRACT
  Filled 2023-07-26: qty 6.7

## 2023-07-26 MED ORDER — PREDNISONE 50 MG PO TABS: 50.0000 mg | ORAL_TABLET | Freq: Every day | ORAL | 0 refills | Status: AC

## 2023-07-26 MED ORDER — DOXYCYCLINE HYCLATE 100 MG PO CAPS: 100.0000 mg | ORAL_CAPSULE | Freq: Two times a day (BID) | ORAL | 0 refills | Status: AC

## 2023-07-26 NOTE — ED Triage Notes (Signed)
 Pt states he has really bad allergies. The pollen is "cutting off my lungs". Pt with labored respirations. States he went to [facility] ED last week for same.

## 2023-07-26 NOTE — ED Provider Notes (Signed)
 [place] EMERGENCY DEPARTMENT AT [facility] Provider Note   CSN: [id number] Arrival date & time: 07/26/23  1257     History Chief Complaint  Patient presents with   Shortness of Breath    HPI Curtis Savage is a 63 y.o. male presenting for chief complaint of SOB states he has been coughing for the past 3 days. Coughs when he talks. Seen at [facility] in [place] recently and diagnosed with bronchitis.  Using albuterol 2-3 times   Patient's recorded medical, surgical, social, medication list and allergies were reviewed in the Snapshot window as part of the initial history.   Review of Systems   Review of Systems  Constitutional:  Negative for chills and fever.  HENT:  Negative for ear pain and sore throat.   Eyes:  Negative for pain and visual disturbance.  Respiratory:  Positive for cough and wheezing. Negative for shortness of breath.   Cardiovascular:  Negative for chest pain and palpitations.  Gastrointestinal:  Negative for abdominal pain and vomiting.  Genitourinary:  Negative for dysuria and hematuria.  Musculoskeletal:  Negative for arthralgias and back pain.  Skin:  Negative for color change and rash.  Neurological:  Negative for seizures and syncope.  All other systems reviewed and are negative.   Physical Exam Updated Vital Signs BP 126/87   Pulse 79   Temp 98.5 F (36.9 C) (Oral)   Resp (!) 22   Ht 5\' 11"  (1.803 m)   Wt 102.1 kg   SpO2 99%   BMI 31.38 kg/m  Physical Exam Vitals and nursing note reviewed.  Constitutional:      General: He is not in acute distress.    Appearance: He is well-developed.  HENT:     Head: Normocephalic and atraumatic.  Eyes:     Conjunctiva/sclera: Conjunctivae normal.  Cardiovascular:     Rate and Rhythm: Normal rate and regular rhythm.     Heart sounds: No murmur heard. Pulmonary:     Effort: Pulmonary effort is normal. No respiratory distress.     Breath sounds: Decreased breath sounds present.  Abdominal:      Palpations: Abdomen is soft.     Tenderness: There is no abdominal tenderness.  Musculoskeletal:        General: No swelling.     Cervical back: Neck supple.  Skin:    General: Skin is warm and dry.     Capillary Refill: Capillary refill takes less than 2 seconds.  Neurological:     Mental Status: He is alert.  Psychiatric:        Mood and Affect: Mood normal.      ED Course/ Medical Decision Making/ A&P    Procedures Procedures   Medications Ordered in ED Medications  albuterol (VENTOLIN HFA) 108 (90 Base) MCG/ACT inhaler 2 puff (has no administration in time range)  ipratropium-albuterol (DUONEB) 0.5-2.5 (3) MG/3ML nebulizer solution 3 mL (3 mLs Nebulization Given 07/26/23 1336)   Medical Decision Making:   Cantrell Larouche is a 63 y.o. male with a history of asthma, who presented to the ED today with acute SOB. Relatively acute onset in the setting of recent moving to [place]. On my initial exam, the pt was SOB and tachypneic. Audible wheezing and grossly decreased breath sounds appreciated.  Reviewed and confirmed nursing documentation for past medical history, family history, social history.    Initial Assessment:   With the patient's presentation of SOB in the above setting, most likely diagnosis is Asthma Exacerbation. Other  diagnoses were considered including (but not limited to) CAP, PE, ACS, viral infection, PTX. These are considered less likely due to history of present illness and physical exam findings.   This is most consistent with an acute complicated illness  Initial Plan:  Empiric treatment of patient's symptoms with immediate initiation of inhaled bronchodilators and PO steroids.   Evaluation for ACS with EKG  Evaluation for infectious versus intrathoracic abnormality with chest x-ray  Patient's Wells score is LOW and patient does not warrant further objective evaluation for PE based on consistency of presentation of alternative diagnosis.  Objective evaluation  as below reviewed   Initial Study Results:   EKG EKG was reviewed independently. Rate, rhythm, axis, intervals all examined and without medically relevant abnormality. ST segments without concerns for elevations.    Radiology:  All images reviewed independently. Agree with radiology report at this time.   DG Chest 2 View Result Date: 07/26/2023 CLINICAL DATA:  A series FINDINGS: There are linear bands of atelectasis in the right mid and lower lobe. There is some stable interstitial and ill-defined nodular opacities clustered in the right upper lobe. There is no pleural effusion or pneumothorax. The cardiomediastinal silhouette is within normal limits. No acute fractures are seen. IMPRESSION: 1. Linear bands of atelectasis in the right mid and lower lobe. 2. Stable interstitial and ill-defined nodular opacities clustered in the right upper lobe, possibly infectious/inflammatory. Follow-up chest x-ray recommended in 4-6 weeks to confirm resolution. Electronically Signed   By: Darliss Cheney M.D.   On: 07/26/2023 15:53   Final Assessment and Plan:   After initiation of medical therapies, patient is grossly improved and no longer in acute distress.   Disposition:  I have considered need for hospitalization, however, considering all of the above, I believe this patient is stable for discharge at this time.  Patient/family educated about specific return precautions for given chief complaint and symptoms.  Patient/family educated about follow-up with PCP.     Patient/family expressed understanding of return precautions and need for follow-up. Patient spoken to regarding all imaging and laboratory results and appropriate follow up for these results. All education provided in verbal form with additional information in written form. Time was allowed for answering of patient questions. Patient discharged.    Emergency Department Medication Summary:   Medications  albuterol (VENTOLIN HFA) 108 (90 Base)  MCG/ACT inhaler 2 puff (has no administration in time range)  ipratropium-albuterol (DUONEB) 0.5-2.5 (3) MG/3ML nebulizer solution 3 mL (3 mLs Nebulization Given 07/26/23 1336)         Clinical Impression:  1. SOB (shortness of breath)   2. Mild asthma with exacerbation, unspecified whether persistent      Discharge  Clinical Impression:  1. SOB (shortness of breath)   2. Mild asthma with exacerbation, unspecified whether persistent      Discharge   Final Clinical Impression(s) / ED Diagnoses Final diagnoses:  SOB (shortness of breath)  Mild asthma with exacerbation, unspecified whether persistent    Rx / DC Orders ED Discharge Orders          Ordered    doxycycline (VIBRAMYCIN) 100 MG capsule  2 times daily,   Status:  Discontinued        07/26/23 1723    predniSONE (DELTASONE) 50 MG tablet  Daily        07/26/23 1806    doxycycline (VIBRAMYCIN) 100 MG capsule  2 times daily        07/26/23 1806  Onetha Bile, MD 07/26/23 1807

## 2023-07-26 NOTE — ED Provider Triage Note (Signed)
 Emergency Medicine Provider Triage Evaluation Note  Markanthony Gedney , a 63 y.o. male  was evaluated in triage.  Pt complains of shortness of breath.  Worsening shortness of breath the past 2 days.  Thinks it is due to his allergies.  He has inhaler with some improvement.  Takes Claritin daily.  Review of Systems  Positive: SOB Negative: Chest pain  Physical Exam  BP 132/87   Pulse 81   Temp 98.4 F (36.9 C) (Oral)   Resp 20   Ht 5\' 11"  (1.803 m)   Wt 102.1 kg   SpO2 100% Comment: Simultaneous filing. User may not have seen previous data.  BMI 31.38 kg/m  Gen:   Awake, no distress   Resp:  Normal effort  MSK:   Moves extremities without difficulty  Other:    Medical Decision Making  Medically screening exam initiated at 1:36 PM.  Appropriate orders placed.  Ariz Morella was informed that the remainder of the evaluation will be completed by another provider, this initial triage assessment does not replace that evaluation, and the importance of remaining in the ED until their evaluation is complete.    Sonnie Dusky, PA-C 07/26/23 1337
